# Patient Record
Sex: Female | Born: 1985 | Hispanic: No | Marital: Single | Smoking: Never smoker
Health system: Southern US, Community
[De-identification: ages and names within clinical notes are randomized; demographics above are authoritative.]

## PROBLEM LIST (undated history)

## (undated) DIAGNOSIS — I10 Essential (primary) hypertension: Secondary | ICD-10-CM

## (undated) HISTORY — PX: APPENDECTOMY: SHX54

---

## 2017-10-09 ENCOUNTER — Ambulatory Visit (HOSPITAL_COMMUNITY)
Admission: EM | Admit: 2017-10-09 | Discharge: 2017-10-09 | Disposition: A | Discharge status: Home / Self Care | Payer: Self-pay

## 2017-10-09 NOTE — ED Notes (Signed)
Per pt access, pt left 

## 2021-07-02 ENCOUNTER — Encounter (HOSPITAL_BASED_OUTPATIENT_CLINIC_OR_DEPARTMENT_OTHER): Payer: Self-pay | Admitting: Orthopedic Surgery

## 2021-07-02 ENCOUNTER — Other Ambulatory Visit: Payer: Self-pay

## 2021-07-02 ENCOUNTER — Other Ambulatory Visit: Payer: Self-pay | Admitting: Orthopedic Surgery

## 2021-07-04 NOTE — H&P (Signed)
Primary Care Provider: Dr. Malen Gauze Referring Provider: Dr. Barb Merino Comp: No Date of Injury or Onset: 06/27/2021  History: CC / Reason for Visit: Right wrist HPI: This patient is a 36 year old, right-hand-dominant, crew trainer at [employer] who indicates that she slipped and fell onto an outstretched hand.  She saw Dr. Cleophas Dunker in [place] yesterday who x-rayed her and found her to have a scaphoid fracture.  She was placed into a thumb spica splint and referred here for further evaluation and treatment.  Past medical history, past surgical history, family history, social history, medications, allergies and review of systems are thoroughly reviewed by me, signed and scanned into SRS today.  The patient is otherwise healthy.  Exam:  Vitals: Refer to EMR. Constitutional:  WD, WN, NAD HEENT:  NCAT, EOMI Neuro/Psych:  Alert & oriented to person, place, and time; appropriate mood & affect Lymphatic: No generalized UE edema or lymphadenopathy Extremities / MSK:  Both UE are normal with respect to appearance, ranges of motion, joint stability, muscle strength/tone, sensation, & perfusion except as otherwise noted:  The right wrist is mildly swollen over the radial aspect.  She has full digital motion of index through long finger.  There is tenderness with wrist range of motion and thumb range of motion.  There is also tenderness directly in the anatomical snuffbox.  Watson's not tested.  NVI.  Labs / Xrays:  No radiographic studies obtained today.  4 views of the right wrist were ordered and obtained yesterday and viewed on spectra and demonstrated a closed, waist of the scaphoid fracture with mild displacement.  Assessment: Right scaphoid fracture  Plan:  We discussed these findings including a closed conservative treatment in a thumb spica cast versus open treatment with internal fixation utilizing a screw.  We discussed the pros and cons of both of these procedures.  The patient wishes to  move forward with ORIF of right scaphoid fracture.  The details of the operative procedure were discussed with the patient.  Questions were invited and answered.  In addition to the goal of the procedure, the risks of the procedure to include but not limited to bleeding; infection; damage to the nerves or blood vessels that could result in bleeding, numbness, weakness, chronic pain, and the need for additional procedures; stiffness; the need for revision surgery; and anesthetic risks were reviewed.  No specific outcome was guaranteed or implied.    Work status: All interested parties should please consider this patient to be out of work entirely if no work is available that complies with the restrictions detailed below.  If these restrictions result in the patient being out of work entirely, the employer is expected to provide documentation of such to all interested third parties such as disability insurance companies: With the right upper extremity, must wear splint.  No lifting gripping or grasping greater than pencil and paper tasks.  Electronically verified by:  Cliffton Asters Janee Morn, MD

## 2021-07-05 ENCOUNTER — Ambulatory Visit (HOSPITAL_BASED_OUTPATIENT_CLINIC_OR_DEPARTMENT_OTHER): Payer: Medicaid Other | Admitting: Certified Registered"

## 2021-07-05 ENCOUNTER — Ambulatory Visit (HOSPITAL_BASED_OUTPATIENT_CLINIC_OR_DEPARTMENT_OTHER)
Admission: RE | Admit: 2021-07-05 | Discharge: 2021-07-05 | Disposition: A | Discharge status: Home / Self Care | Payer: Medicaid Other | Source: Ambulatory Visit | Attending: Orthopedic Surgery | Admitting: Orthopedic Surgery

## 2021-07-05 ENCOUNTER — Ambulatory Visit (HOSPITAL_COMMUNITY): Payer: Medicaid Other

## 2021-07-05 ENCOUNTER — Encounter (HOSPITAL_BASED_OUTPATIENT_CLINIC_OR_DEPARTMENT_OTHER)
Admission: RE | Disposition: A | Discharge status: Home / Self Care | Payer: Self-pay | Source: Ambulatory Visit | Attending: Orthopedic Surgery

## 2021-07-05 ENCOUNTER — Encounter (HOSPITAL_BASED_OUTPATIENT_CLINIC_OR_DEPARTMENT_OTHER): Payer: Self-pay | Admitting: Orthopedic Surgery

## 2021-07-05 ENCOUNTER — Other Ambulatory Visit: Payer: Self-pay

## 2021-07-05 DIAGNOSIS — W010XXA Fall on same level from slipping, tripping and stumbling without subsequent striking against object, initial encounter: Secondary | ICD-10-CM | POA: Insufficient documentation

## 2021-07-05 DIAGNOSIS — S62001A Unspecified fracture of navicular [scaphoid] bone of right wrist, initial encounter for closed fracture: Secondary | ICD-10-CM | POA: Insufficient documentation

## 2021-07-05 DIAGNOSIS — Z6841 Body Mass Index (BMI) 40.0 and over, adult: Secondary | ICD-10-CM | POA: Diagnosis not present

## 2021-07-05 DIAGNOSIS — Z419 Encounter for procedure for purposes other than remedying health state, unspecified: Secondary | ICD-10-CM

## 2021-07-05 HISTORY — PX: ORIF SCAPHOID FRACTURE: SHX2130

## 2021-07-05 HISTORY — DX: Essential (primary) hypertension: I10

## 2021-07-05 LAB — POCT PREGNANCY, URINE: Preg Test, Ur: NEGATIVE

## 2021-07-05 SURGERY — OPEN REDUCTION INTERNAL FIXATION (ORIF) SCAPHOID FRACTURE
Anesthesia: Monitor Anesthesia Care | Laterality: Right

## 2021-07-05 MED ORDER — FENTANYL CITRATE (PF) 100 MCG/2ML IJ SOLN: 25.0000 ug | INTRAMUSCULAR | Status: DC | PRN

## 2021-07-05 MED ORDER — FENTANYL CITRATE (PF) 100 MCG/2ML IJ SOLN
INTRAMUSCULAR | Status: AC
  Filled 2021-07-05: qty 2

## 2021-07-05 MED ORDER — CEFAZOLIN IN SODIUM CHLORIDE 3-0.9 GM/100ML-% IV SOLN
3.0000 g | INTRAVENOUS | Status: AC
  Administered 2021-07-05: 3 g via INTRAVENOUS

## 2021-07-05 MED ORDER — 0.9 % SODIUM CHLORIDE (POUR BTL) OPTIME
TOPICAL | Status: DC | PRN
  Administered 2021-07-05: 120 mL

## 2021-07-05 MED ORDER — ONDANSETRON HCL 4 MG/2ML IJ SOLN
INTRAMUSCULAR | Status: DC | PRN
Start: 2021-07-05 — End: 2021-07-05
  Administered 2021-07-05: 4 mg via INTRAVENOUS

## 2021-07-05 MED ORDER — ACETAMINOPHEN 325 MG PO TABS: 650.0000 mg | ORAL_TABLET | Freq: Four times a day (QID) | ORAL | Status: DC

## 2021-07-05 MED ORDER — CEFAZOLIN IN SODIUM CHLORIDE 3-0.9 GM/100ML-% IV SOLN
INTRAVENOUS | Status: AC
  Filled 2021-07-05: qty 100

## 2021-07-05 MED ORDER — ONDANSETRON HCL 4 MG/2ML IJ SOLN: 4.0000 mg | Freq: Four times a day (QID) | INTRAMUSCULAR | Status: DC | PRN

## 2021-07-05 MED ORDER — LACTATED RINGERS IV SOLN: INTRAVENOUS | Status: DC

## 2021-07-05 MED ORDER — FENTANYL CITRATE (PF) 100 MCG/2ML IJ SOLN
100.0000 ug | Freq: Once | INTRAMUSCULAR | Status: AC
  Administered 2021-07-05: 100 ug via INTRAVENOUS

## 2021-07-05 MED ORDER — MIDAZOLAM HCL 2 MG/2ML IJ SOLN
2.0000 mg | Freq: Once | INTRAMUSCULAR | Status: AC
  Administered 2021-07-05: 2 mg via INTRAVENOUS

## 2021-07-05 MED ORDER — OXYCODONE HCL 5 MG PO TABS
5.0000 mg | ORAL_TABLET | Freq: Four times a day (QID) | ORAL | 0 refills | Status: DC | PRN
Start: 2021-07-05 — End: 2022-08-14

## 2021-07-05 MED ORDER — OXYCODONE HCL 5 MG/5ML PO SOLN: 5.0000 mg | Freq: Once | ORAL | Status: DC | PRN

## 2021-07-05 MED ORDER — BUPIVACAINE-EPINEPHRINE (PF) 0.5% -1:200000 IJ SOLN
INTRAMUSCULAR | Status: DC | PRN
  Administered 2021-07-05: 30 mL via PERINEURAL

## 2021-07-05 MED ORDER — BUPIVACAINE-EPINEPHRINE (PF) 0.5% -1:200000 IJ SOLN
INTRAMUSCULAR | Status: AC
  Filled 2021-07-05: qty 30

## 2021-07-05 MED ORDER — LIDOCAINE HCL (PF) 1 % IJ SOLN
INTRAMUSCULAR | Status: AC
  Filled 2021-07-05: qty 30

## 2021-07-05 MED ORDER — IBUPROFEN 200 MG PO TABS: 600.0000 mg | ORAL_TABLET | Freq: Four times a day (QID) | ORAL | Status: DC

## 2021-07-05 MED ORDER — OXYCODONE HCL 5 MG PO TABS: 5.0000 mg | ORAL_TABLET | Freq: Once | ORAL | Status: DC | PRN

## 2021-07-05 MED ORDER — MIDAZOLAM HCL 2 MG/2ML IJ SOLN
INTRAMUSCULAR | Status: AC
  Filled 2021-07-05: qty 2

## 2021-07-05 MED ORDER — PROPOFOL 500 MG/50ML IV EMUL
INTRAVENOUS | Status: DC | PRN
  Administered 2021-07-05: 75 ug/kg/min via INTRAVENOUS

## 2021-07-05 SURGICAL SUPPLY — 60 items
APL PRP STRL LF DISP 70% ISPRP (MISCELLANEOUS) ×1
BAND INSRT 18 STRL LF DISP RB (MISCELLANEOUS)
BAND RUBBER #18 3X1/16 STRL (MISCELLANEOUS) IMPLANT
BIT DRILL 1.8 CANN MAX VPC (BIT) ×1 IMPLANT
BLADE MINI RND TIP GREEN BEAV (BLADE) IMPLANT
BLADE SURG 15 STRL LF DISP TIS (BLADE) ×1 IMPLANT
BLADE SURG 15 STRL SS (BLADE) ×2
BNDG CMPR 5X2 CHSV 1 LYR STRL (GAUZE/BANDAGES/DRESSINGS)
BNDG CMPR 9X4 STRL LF SNTH (GAUZE/BANDAGES/DRESSINGS) ×1
BNDG COHESIVE 2X5 TAN ST LF (GAUZE/BANDAGES/DRESSINGS) IMPLANT
BNDG COHESIVE 4X5 TAN ST LF (GAUZE/BANDAGES/DRESSINGS) ×2 IMPLANT
BNDG ESMARK 4X9 LF (GAUZE/BANDAGES/DRESSINGS) ×2 IMPLANT
BNDG GAUZE ELAST 4 BULKY (GAUZE/BANDAGES/DRESSINGS) ×2 IMPLANT
BRUSH SCRUB EZ PLAIN DRY (MISCELLANEOUS) ×1 IMPLANT
CANISTER SUCT 1200ML W/VALVE (MISCELLANEOUS) ×2 IMPLANT
CHLORAPREP W/TINT 26 (MISCELLANEOUS) ×2 IMPLANT
CORD BIPOLAR FORCEPS 12FT (ELECTRODE) ×2 IMPLANT
COVER BACK TABLE 60X90IN (DRAPES) ×2 IMPLANT
COVER MAYO STAND STRL (DRAPES) ×2 IMPLANT
CUFF TOURN SGL QUICK 24 (TOURNIQUET CUFF) ×2
CUFF TRNQT CYL 24X4X16.5-23 (TOURNIQUET CUFF) IMPLANT
DRAPE C-ARM 42X72 X-RAY (DRAPES) ×2 IMPLANT
DRAPE EXTREMITY T 121X128X90 (DISPOSABLE) ×2 IMPLANT
DRAPE SURG 17X23 STRL (DRAPES) ×2 IMPLANT
DRSG ADAPTIC 3X8 NADH LF (GAUZE/BANDAGES/DRESSINGS) ×2 IMPLANT
DRSG EMULSION OIL 3X3 NADH (GAUZE/BANDAGES/DRESSINGS) IMPLANT
ELECT REM PT RETURN 9FT ADLT (ELECTROSURGICAL) ×2
ELECTRODE REM PT RTRN 9FT ADLT (ELECTROSURGICAL) ×1 IMPLANT
GAUZE SPONGE 4X4 12PLY STRL LF (GAUZE/BANDAGES/DRESSINGS) ×2 IMPLANT
GLOVE SRG 8 PF TXTR STRL LF DI (GLOVE) ×1 IMPLANT
GLOVE SURG ENC MOIS LTX SZ7.5 (GLOVE) ×3 IMPLANT
GLOVE SURG LTX SZ6.5 (GLOVE) ×2 IMPLANT
GLOVE SURG UNDER POLY LF SZ7 (GLOVE) ×2 IMPLANT
GLOVE SURG UNDER POLY LF SZ8 (GLOVE) ×2
GOWN STRL REUS W/TWL XL LVL3 (GOWN DISPOSABLE) ×2 IMPLANT
K-WIRE .045X4 (WIRE) ×2 IMPLANT
K-WIRE COCR 0.9X95 (WIRE) ×2
KWIRE COCR 0.9X95 (WIRE) IMPLANT
NDL 16GX1 1/2 (NEEDLE) IMPLANT
NDL HYPO 25X1 1.5 SAFETY (NEEDLE) IMPLANT
NEEDLE 16GX1 1/2 (NEEDLE) IMPLANT
NEEDLE HYPO 25X1 1.5 SAFETY (NEEDLE) ×2 IMPLANT
NS IRRIG 1000ML POUR BTL (IV SOLUTION) ×2 IMPLANT
PACK BASIN DAY SURGERY FS (CUSTOM PROCEDURE TRAY) ×2 IMPLANT
PADDING CAST ABS 4INX4YD NS (CAST SUPPLIES)
PADDING CAST ABS COTTON 4X4 ST (CAST SUPPLIES) IMPLANT
PENCIL SMOKE EVACUATOR (MISCELLANEOUS) ×2 IMPLANT
SCREW VPC 2.5X16MM (Screw) ×1 IMPLANT
SLEEVE SCD COMPRESS KNEE MED (STOCKING) ×2 IMPLANT
SLING ARM FOAM STRAP LRG (SOFTGOODS) ×1 IMPLANT
STOCKINETTE 6  STRL (DRAPES) ×1
STOCKINETTE 6 STRL (DRAPES) ×1 IMPLANT
SUT VIC AB 2-0 CT3 27 (SUTURE) ×2 IMPLANT
SUT VICRYL 4-0 PS2 18IN ABS (SUTURE) IMPLANT
SUT VICRYL RAPIDE 4-0 (SUTURE) ×1 IMPLANT
SUT VICRYL RAPIDE 4/0 PS 2 (SUTURE) ×2 IMPLANT
SYR 10ML LL (SYRINGE) IMPLANT
SYR BULB EAR ULCER 3OZ GRN STR (SYRINGE) ×2 IMPLANT
TOWEL GREEN STERILE FF (TOWEL DISPOSABLE) ×2 IMPLANT
UNDERPAD 30X36 HEAVY ABSORB (UNDERPADS AND DIAPERS) ×2 IMPLANT

## 2021-07-05 NOTE — Discharge Instructions (Addendum)
Discharge Instructions   You have a dressing with a plaster splint incorporated in it. Move your fingers as much as possible, making a full fist and fully opening the fist. Elevate your hand to reduce pain & swelling of the digits.  Ice over the operative site may be helpful to reduce pain & swelling.  DO NOT USE HEAT. Pain medicine has been prescribed for you.  Take Tylenol 650 mg and Ibuprofen 600 mg every 6 hours together (over the counter). Take Oxycodone 5 mg additionally for severe post operative pain. Leave the dressing in place until you return to our office.  You may shower, but keep the bandage clean & dry.  You may drive a car when you are off of prescription pain medications and can safely control your vehicle with both hands. Call our office to schedule a post operative appointment in 10-15 days from the date of surgery.   Please call [phone] during normal business hours or [phone] after hours for any problems. Including the following:  - excessive redness of the incisions - drainage for more than 4 days - fever of more than 101.5 F  *Please note that pain medications will not be refilled after hours or on weekends.   Work Status: You may return to work on 07/13/20 with no lifting, gripping or grasping greater than pencil and paper tasks with the right upper extremity.       Post Anesthesia Home Care Instructions  Activity: Get plenty of rest for the remainder of the day. A responsible individual must stay with you for 24 hours following the procedure.  For the next 24 hours, DO NOT: -Drive a car -Advertising copywriter -Drink alcoholic beverages -Take any medication unless instructed by your physician -Make any legal decisions or sign important papers.  Meals: Start with liquid foods such as gelatin or soup. Progress to regular foods as tolerated. Avoid greasy, spicy, heavy foods. If nausea and/or vomiting occur, drink only clear liquids until the nausea  and/or vomiting subsides. Call your physician if vomiting continues.  Special Instructions/Symptoms: Your throat may feel dry or sore from the anesthesia or the breathing tube placed in your throat during surgery. If this causes discomfort, gargle with warm salt water. The discomfort should disappear within 24 hours.  If you had a scopolamine patch placed behind your ear for the management of post- operative nausea and/or vomiting:  1. The medication in the patch is effective for 72 hours, after which it should be removed.  Wrap patch in a tissue and discard in the trash. Wash hands thoroughly with soap and water. 2. You may remove the patch earlier than 72 hours if you experience unpleasant side effects which may include dry mouth, dizziness or visual disturbances. 3. Avoid touching the patch. Wash your hands with soap and water after contact with the patch.        Regional Anesthesia Blocks  1. Numbness or the inability to move the "blocked" extremity may last from 3-48 hours after placement. The length of time depends on the medication injected and your individual response to the medication. If the numbness is not going away after 48 hours, call your surgeon.  2. The extremity that is blocked will need to be protected until the numbness is gone and the  Strength has returned. Because you cannot feel it, you will need to take extra care to avoid injury. Because it may be weak, you may have difficulty moving it or using it. You may not  know what position it is in without looking at it while the block is in effect.  3. For blocks in the legs and feet, returning to weight bearing and walking needs to be done carefully. You will need to wait until the numbness is entirely gone and the strength has returned. You should be able to move your leg and foot normally before you try and bear weight or walk. You will need someone to be with you when you first try to ensure you do not fall and possibly risk  injury.  4. Bruising and tenderness at the needle site are common side effects and will resolve in a few days.  5. Persistent numbness or new problems with movement should be communicated to the surgeon or the [facility] [phone] [address] [facility] [phone]).

## 2021-07-05 NOTE — Progress Notes (Signed)
Assisted Dr. Hodierne with right, ultrasound guided, supraclavicular block. Side rails up, monitors on throughout procedure. See vital signs in flow sheet. Tolerated Procedure well.  

## 2021-07-05 NOTE — Transfer of Care (Signed)
Immediate Anesthesia Transfer of Care Note  Patient: Robyn Hanna  Procedure(s) Performed: OPEN REDUCTION INTERNAL FIXATION OF RIGHT SCAPHOID (Right)  Patient Location: PACU  Anesthesia Type:MAC combined with regional for post-op pain  Level of Consciousness: awake, alert , oriented and patient cooperative  Airway & Oxygen Therapy: Patient Spontanous Breathing and Patient connected to face mask oxygen  Post-op Assessment: Report given to RN and Post -op Vital signs reviewed and stable  Post vital signs: Reviewed and stable  Last Vitals:  Vitals Value Taken Time  BP    Temp    Pulse 95 07/05/21 0831  Resp    SpO2 86 % 07/05/21 0831  Vitals shown include unvalidated device data.  Last Pain:  Vitals:   07/05/21 0635  TempSrc: Oral  PainSc: 7       Patients Stated Pain Goal: 3 ([id number])  Complications: No notable events documented.

## 2021-07-05 NOTE — Anesthesia Preprocedure Evaluation (Signed)
Anesthesia Evaluation  Patient identified by MRN, date of birth, ID band Patient awake    Reviewed: Allergy & Precautions, H&P , NPO status , Patient's Chart, lab work & pertinent test results  Airway Mallampati: II   Neck ROM: full    Dental   Pulmonary neg pulmonary ROS,    breath sounds clear to auscultation       Cardiovascular hypertension,  Rhythm:regular Rate:Normal     Neuro/Psych    GI/Hepatic   Endo/Other  Morbid obesity  Renal/GU      Musculoskeletal   Abdominal   Peds  Hematology   Anesthesia Other Findings   Reproductive/Obstetrics                             Anesthesia Physical Anesthesia Plan  ASA: 2  Anesthesia Plan: MAC and Regional   Post-op Pain Management:    Induction: Intravenous  PONV Risk Score and Plan: 2 and Ondansetron, Propofol infusion, Midazolam and Treatment may vary due to age or medical condition  Airway Management Planned: Simple Face Mask  Additional Equipment:   Intra-op Plan:   Post-operative Plan:   Informed Consent: I have reviewed the patients History and Physical, chart, labs and discussed the procedure including the risks, benefits and alternatives for the proposed anesthesia with the patient or authorized representative who has indicated his/her understanding and acceptance.     Dental advisory given  Plan Discussed with: CRNA, Anesthesiologist and Surgeon  Anesthesia Plan Comments:         Anesthesia Quick Evaluation

## 2021-07-05 NOTE — Op Note (Signed)
07/05/2021  7:30 AM  PATIENT:  Robyn Hanna  36 y.o. female  PRE-OPERATIVE DIAGNOSIS: Right scaphoid fracture  POST-OPERATIVE DIAGNOSIS:  Same  PROCEDURE: ORIF right scaphoid fracture  SURGEON: Rayvon Char. Grandville Silos, MD  PHYSICIAN ASSISTANT: Morley Kos, OPA-C  ANESTHESIA: Regional/MAC  SPECIMENS:  None  DRAINS:   None  EBL: Less than 10 mL  PREOPERATIVE INDICATIONS:  [name] is a  36 y.o. female with a slightly displaced right scaphoid waist fracture.  The risks benefits and alternatives were discussed with the patient preoperatively including but not limited to the risks of infection, bleeding, nerve injury, cardiopulmonary complications, the need for revision surgery, among others, and the patient verbalized understanding and consented to proceed.  OPERATIVE IMPLANTS: Biomet 2.5 mm VPC screw, length 16  OPERATIVE PROCEDURE:  After receiving prophylactic antibiotics and a regional block, the patient was escorted to the operative theatre and placed in a supine position.  A surgical time-out was performed during which the planned procedure, proposed operative site, and the correct patient identity were compared to the operative consent and agreement confirmed by the circulating nurse according to current facility policy.  Following application of a tourniquet to the operative extremity, the exposed skin was prescribed with a Hibiclens brush before being formally prepped with Chloraprep and draped in the usual sterile fashion.  The limb was exsanguinated with an Esmarch bandage and the tourniquet inflated to approximately 123mHg higher than systolic BP.  Using fluoroscopic guidance, the fracture was reduced through this positioning and secured with a 0.045 inch K wire tentatively.   Incision was made dorsally over the scapholunate interval with spreading dissection down to the capsule.  A 16-gauge needle was then used as a positioning device and soft tissue protector.  It  was placed at the appropriate starting point, and a 0.045 inch K wire driven using fluoroscopic guidance along the path intended for the fixation screw.  Once it was appropriately positioned, it was removed and replaced with the actual guidewire for the screw.  The appropriate length was measured, and the measurement was 20 so a 16 mm screw was selected to help ensure there was no intra-articular metallic prominence.  The screw was then placed with good purchase.  The provisional K wire was removed and final images were obtained.  Wrist range of motion was smooth and fluid, the fracture was well reduced and compressed, and stable.  The screw appeared to be entirely within the confines of the bone when examined fluoroscopically.  The tourniquet was released, hemostasis at the site obtained with direct pressure and the skin was irrigated.  It was closed with 4-0 Vicryl Rapide interrupted suture and a short arm splint dressing was applied.  She was taken to the recovery room in stable condition.  DISPOSITION: She will be discharged home today with typical instructions, returning in 10 to 15 days with new x-rays of the right wrist out of splint to include a scaphoid view and transition to a forearm-based removable splint.

## 2021-07-05 NOTE — Anesthesia Procedure Notes (Signed)
Anesthesia Regional Block: Supraclavicular block   Pre-Anesthetic Checklist: , timeout performed,  Correct Patient, Correct Site, Correct Laterality,  Correct Procedure, Correct Position, site marked,  Risks and benefits discussed,  Surgical consent,  Pre-op evaluation,  At surgeon's request and post-op pain management  Laterality: Right  Prep: chloraprep       Needles:  Injection technique: Single-shot  Needle Type: Echogenic Stimulator Needle     Needle Length: 5cm  Needle Gauge: 22     Additional Needles:   Procedures:, nerve stimulator,,,,,     Nerve Stimulator or Paresthesia:  Response: biceps flexion, 0.45 mA  Additional Responses:   Narrative:  Start time: 07/05/2021 7:10 AM End time: 07/05/2021 7:20 AM Injection made incrementally with aspirations every 5 mL.  Performed by: Personally  Anesthesiologist: Achille Rich, MD  Additional Notes: Functioning IV was confirmed and monitors were applied.  A 68mm 22ga Arrow echogenic stimulator needle was used. Sterile prep and drape,hand hygiene and sterile gloves were used.  Negative aspiration and negative test dose prior to incremental administration of local anesthetic. The patient tolerated the procedure well.  Ultrasound guidance: relevent anatomy identified, needle position confirmed, local anesthetic spread visualized around nerve(s), vascular puncture avoided.  Image printed for medical record.

## 2021-07-05 NOTE — Interval H&P Note (Signed)
History and Physical Interval Note:  07/05/2021 7:30 AM  Robyn Hanna  has presented today for surgery, with the diagnosis of RIGHT SCAPHOID FRACTURE.  The various methods of treatment have been discussed with the patient and family. After consideration of risks, benefits and other options for treatment, the patient has consented to  Procedure(s) with comments: OPEN REDUCTION INTERNAL FIXATION OF RIGHT SCAPHOID (Right) - PRE-OP BLOCK as a surgical intervention.  The patient's history has been reviewed, patient examined, no change in status, stable for surgery.  I have reviewed the patient's chart and labs.  Questions were answered to the patient's satisfaction.     Jodi Marble

## 2021-07-05 NOTE — Anesthesia Postprocedure Evaluation (Signed)
Anesthesia Post Note  Patient: Secretary/administrator  Procedure(s) Performed: OPEN REDUCTION INTERNAL FIXATION OF RIGHT SCAPHOID (Right)     Patient location during evaluation: PACU Anesthesia Type: Regional and MAC Level of consciousness: awake and alert Pain management: pain level controlled Vital Signs Assessment: post-procedure vital signs reviewed and stable Respiratory status: spontaneous breathing, nonlabored ventilation, respiratory function stable and patient connected to nasal cannula oxygen Cardiovascular status: stable and blood pressure returned to baseline Postop Assessment: no apparent nausea or vomiting Anesthetic complications: no   No notable events documented.  Last Vitals:  Vitals:   07/05/21 0900 07/05/21 0915  BP: 117/83 106/77  Pulse: 93 95  Resp: (!) 27 18  Temp:  36.5 C  SpO2: 95% 93%    Last Pain:  Vitals:   07/05/21 0915  TempSrc:   PainSc: 0-No pain                 Josaphine Shimamoto S

## 2021-07-05 NOTE — Anesthesia Procedure Notes (Addendum)
Procedure Name: MAC Date/Time: 07/05/2021 8:01 AM Performed by: Signe Colt, CRNA Pre-anesthesia Checklist: Patient identified, Emergency Drugs available, Suction available, Patient being monitored and Timeout performed Patient Re-evaluated:Patient Re-evaluated prior to induction Oxygen Delivery Method: Simple face mask

## 2021-07-08 ENCOUNTER — Encounter (HOSPITAL_BASED_OUTPATIENT_CLINIC_OR_DEPARTMENT_OTHER): Payer: Self-pay | Admitting: Orthopedic Surgery

## 2021-09-15 NOTE — Progress Notes (Signed)
?Cardiology Office Note:   ? ?Date:  09/16/2021  ? ?IDCorlene Hanna, DOB [birth year], MRN [id number] ? ?PCP:  Darryll Capers, PA-C ?  ?[facility] Providers ?Cardiologist:  None    ? ?Referring MD: Geraldine Contras, MD  ? ?CC: HF ?Consulted for the evaluation of new HF at the behest of [place], [place], [place] ? ? ?History of Present Illness:   ? ?Robyn Hanna is a 36 y.o. female with a hx of HTN, new HFrEF (35%) from [facility] seen to establish care. ? ?Patient notes that she is feeling better. ?She recently was seen by [facility]: had new SP found to have LVEF 35%.  She lives in [place].  She has [place] [employer].  She was not able to be covered by her [facility] cardiologist.  She did not end up getting her NM Stress test.  She is out of Coreg.  She need financial support for her ARNI. ? ?Since last visit she has had new chest pain- chest tightness with mild exertion.  No history or early CAD with family.  No family history of HF.  She ran out of her Coreg and has had racing heart since.  She has been going to HF classes and has made dietary changes.  She has had no syncope.  She had a mechanical flat getting out of her truck and broke her R wrist.  She is pending sleep study. ? ?Patient reports prior cardiac testing including Echo showing moderate MR and EF 35% (4/22 [facility]). Started On Coreg 6.25 mg PO BID and Entresto 24/26 mg. ? ?No history of pre-eclampsia, gestation HTN or gestational DM.  No Fen-Phen or drug use. ? ? ?Past Medical History:  ?Diagnosis Date  ? Hypertension   ? ? ?Past Surgical History:  ?Procedure Laterality Date  ? APPENDECTOMY    ? ORIF SCAPHOID FRACTURE Right 07/05/2021  ? Procedure: OPEN REDUCTION INTERNAL FIXATION OF RIGHT SCAPHOID;  Surgeon: Mack Hook, MD;  Location: [facility];  Service: Orthopedics;  Laterality: Right;  PRE-OP BLOCK  ? ? ?Current Medications: ?Current Meds  ?Medication Sig  ? acetaminophen (TYLENOL) 325 MG tablet Take 2 tablets (650 mg  total) by mouth every 6 (six) hours.  ? carvedilol (COREG) 6.25 MG tablet Take 1 tablet (6.25 mg total) by mouth 2 (two) times daily.  ? diazepam (VALIUM) 10 MG tablet Take 10 mg by mouth every 6 (six) hours as needed for anxiety.  ? ibuprofen (ADVIL) 200 MG tablet Take 3 tablets (600 mg total) by mouth every 6 (six) hours.  ? ivabradine (CORLANOR) 5 MG TABS tablet Take 3 tablets (15 mg total) by mouth once for 1 dose. Take 2 hours prior to CT Scan  ? metoprolol tartrate (LOPRESSOR) 100 MG tablet Take 1 tablet (100 mg total) by mouth once for 1 dose. Take 2 hours prior to CT scan  ? montelukast (SINGULAIR) 10 MG tablet Take 10 mg by mouth at bedtime.  ? norethindrone-ethinyl estradiol-iron (ESTROSTEP FE) 1-20/1-30/1-35 MG-MCG tablet Take 1 tablet by mouth daily.  ? sacubitril-valsartan (ENTRESTO) 49-51 MG Take 1 tablet by mouth 2 (two) times daily.  ? [DISCONTINUED] carvedilol (COREG) 3.125 MG tablet Take 3.125 mg by mouth 2 (two) times daily.  ? [DISCONTINUED] ENTRESTO 24-26 MG Take 1 tablet by mouth 2 (two) times daily.  ?  ? ?Allergies:   Patient has no known allergies.  ? ?Social History  ? ?Socioeconomic History  ? Marital status: Single  ?  Spouse  name: Not on file  ? Number of children: Not on file  ? Years of education: Not on file  ? Highest education level: Not on file  ?Occupational History  ? Not on file  ?Tobacco Use  ? Smoking status: Never  ? Smokeless tobacco: Never  ?Vaping Use  ? Vaping Use: Never used  ?Substance and Sexual Activity  ? Alcohol use: Never  ? Drug use: Never  ? Sexual activity: Not on file  ?Other Topics Concern  ? Not on file  ?Social History Narrative  ? Not on file  ? ?Social Determinants of Health  ? ?Financial Resource Strain: Not on file  ?Food Insecurity: Not on file  ?Transportation Needs: Not on file  ?Physical Activity: Not on file  ?Stress: Not on file  ?Social Connections: Not on file  ?  ? ?Family History: ?The patient's family history includes Cancer in her mother;  Diabetes in her father. ? ?ROS:   ?Please see the history of present illness.    ? All other systems reviewed and are negative. ? ?EKGs/Labs/Other Studies Reviewed:   ? ?Recent Labs: ?No results found for requested labs within last 8760 hours.  ?Recent Lipid Panel ?No results found for: CHOL, TRIG, HDL, CHOLHDL, VLDL, LDLCALC, LDLDIRECT ? ?    ? ?Physical Exam:   ? ?VS:  BP 132/80   Pulse (!) 115   Ht 5\' 2"  (1.575 m)   Wt 256 lb 3.2 oz (116.2 kg)   SpO2 99%   BMI 46.86 kg/m?    ? ?Wt Readings from Last 3 Encounters:  ?09/16/21 256 lb 3.2 oz (116.2 kg)  ?07/05/21 268 lb 8.3 oz (121.8 kg)  ?  ? ?GEN:  Well nourished, well developed in no acute distress ?HEENT: Normal ?NECK: No JVD; No carotid bruits ?LYMPHATICS: No lymphadenopathy ?CARDIAC: Regular tachycardia, no murmurs, rubs, gallops ?RESPIRATORY:  Clear to auscultation without rales, wheezing or rhonchi  ?ABDOMEN: Soft, non-tender, non-distended ?MUSCULOSKELETAL:  No edema; No deformity  ?SKIN: Warm and dry ?NEUROLOGIC:  Alert and oriented x 3 ?PSYCHIATRIC:  Normal affect  ? ?ASSESSMENT:   ? ?1. HFrEF (heart failure with reduced ejection fraction) (HCC)   ?2. Essential hypertension   ? ?PLAN:   ? ? Heart Failure Reduced Ejection Fraction  ?HTN ?Sinus tachycardia ?- NYHA class I, Stage B, euvolemic, etiology unclear ?- Diuretic regimen: None needed presently ?- Discussed the importance of fluid restriction of < 2 L, salt restriction, and checking daily weights, she still have one HF class left ?- we had discussed LHC/RHC; after SDM discussion will do Cardiac CT first (metoprolol and ivabradine likely needed) ?- will increase ARNI to 49/51 and will attempt to give coupons or samples ?- will return Coreg 6.25 mg PO BID ?- CBC (For potential CMR), BMP, and pregnancy screen in [place] after new Entresto ?- aldactone likely added after labs ?- SGLT2i at next visit (will need extra SW support) ?- if negative CT will need CMR ? ?Fall f/u me or APP ? ? ?   ? ?Time Spent  Directly with Patient: ?  ?I have spent a total of 60 minutesn with the patient reviewing notes, imaging, EKGs, labs and examining the patient as well as establishing an assessment and plan that was discussed personally with the patient.  > 50% of time was spent in direct patient care. ? ? ? ?Medication Adjustments/Labs and Tests Ordered: ?Current medicines are reviewed at length with the patient today.  Concerns regarding medicines are outlined  above.  ?Orders Placed This Encounter  ?Procedures  ? CT CORONARY MORPH W/CTA COR W/SCORE W/CA W/CM &/OR WO/CM  ? ?Meds ordered this encounter  ?Medications  ? sacubitril-valsartan (ENTRESTO) 49-51 MG  ?  Sig: Take 1 tablet by mouth 2 (two) times daily.  ?  Dispense:  60 tablet  ?  Refill:  6  ? carvedilol (COREG) 6.25 MG tablet  ?  Sig: Take 1 tablet (6.25 mg total) by mouth 2 (two) times daily.  ?  Dispense:  180 tablet  ?  Refill:  3  ? ivabradine (CORLANOR) 5 MG TABS tablet  ?  Sig: Take 3 tablets (15 mg total) by mouth once for 1 dose. Take 2 hours prior to CT Scan  ?  Dispense:  3 tablet  ?  Refill:  0  ? metoprolol tartrate (LOPRESSOR) 100 MG tablet  ?  Sig: Take 1 tablet (100 mg total) by mouth once for 1 dose. Take 2 hours prior to CT scan  ?  Dispense:  1 tablet  ?  Refill:  0  ? ? ?Patient Instructions  ?Medication Instructions:  ?Increase Coreg to 6.25 mg twice daily ?Increase Entresto to 49/51 mg tablets twice daily ? ?Labwork: ?1-2 weeks after starting Entresto: ?CBC ?BMP ?TSH ?Blood PT ? ?Testing/Procedures: ?Cardiac CT ? ?Follow-Up: ?Follow up in the Fall with Dr. Izora Ribashandrasekhar or APP ? ?Any Other Special Instructions Will Be Listed Below (If Applicable). ? ? ? ? ?If you need a refill on your cardiac medications before your next appointment, please call your pharmacy. ? ? ? ? ?Your cardiac CT will be scheduled at one of the below locations:  ? ?[facility] ?[address] ?[place], [place] [address] ?[phone] ? ?OR ? ?[facility] ?[address] ?[address] ?[place], [place] [address] ?[phone] ? ?If scheduled at [facility], please arrive at the [facility] (Entrance C2) of [place]

## 2021-09-16 ENCOUNTER — Ambulatory Visit (INDEPENDENT_AMBULATORY_CARE_PROVIDER_SITE_OTHER): Payer: Medicaid Other | Admitting: Internal Medicine

## 2021-09-16 ENCOUNTER — Encounter: Payer: Self-pay | Admitting: Internal Medicine

## 2021-09-16 ENCOUNTER — Telehealth: Payer: Self-pay | Admitting: Internal Medicine

## 2021-09-16 VITALS — BP 132/80 | HR 115 | Ht 62.0 in | Wt 256.2 lb

## 2021-09-16 DIAGNOSIS — I10 Essential (primary) hypertension: Secondary | ICD-10-CM

## 2021-09-16 DIAGNOSIS — I5020 Unspecified systolic (congestive) heart failure: Secondary | ICD-10-CM | POA: Insufficient documentation

## 2021-09-16 DIAGNOSIS — Z3490 Encounter for supervision of normal pregnancy, unspecified, unspecified trimester: Secondary | ICD-10-CM | POA: Diagnosis not present

## 2021-09-16 MED ORDER — METOPROLOL TARTRATE 100 MG PO TABS: 100.0000 mg | ORAL_TABLET | Freq: Once | ORAL | 0 refills | Status: DC

## 2021-09-16 MED ORDER — IVABRADINE HCL 5 MG PO TABS: 15.0000 mg | ORAL_TABLET | Freq: Once | ORAL | 0 refills | Status: AC

## 2021-09-16 MED ORDER — SACUBITRIL-VALSARTAN 49-51 MG PO TABS: 1.0000 | ORAL_TABLET | Freq: Two times a day (BID) | ORAL | 6 refills | Status: DC

## 2021-09-16 MED ORDER — CARVEDILOL 6.25 MG PO TABS: 6.2500 mg | ORAL_TABLET | Freq: Two times a day (BID) | ORAL | 3 refills | Status: DC

## 2021-09-16 NOTE — Addendum Note (Signed)
Addended by: Marlyn Corporal A on: 09/16/2021 03:39 PM ? ? Modules accepted: Orders ? ?

## 2021-09-16 NOTE — Patient Instructions (Addendum)
Medication Instructions:  ?Increase Coreg to 6.25 mg twice daily ?Increase Entresto to 49/51 mg tablets twice daily ? ?Labwork: ?1-2 weeks after starting Entresto: ?CBC ?BMP ?TSH ?Blood PT ? ?Testing/Procedures: ?Cardiac CT ? ?Follow-Up: ?Follow up in the Fall with Dr. Izora Ribas or APP ? ?Any Other Special Instructions Will Be Listed Below (If Applicable). ? ? ? ? ?If you need a refill on your cardiac medications before your next appointment, please call your pharmacy. ? ? ? ? ?Your cardiac CT will be scheduled at one of the below locations:  ? ?[facility] ?[address] ?[place], [place] [address] ?[phone] ? ?OR ? ?[facility] ?[address] ?[address] ?[place], [place] [address] ?[phone] ? ?If scheduled at [facility], please arrive at the [facility] and Children's Entrance (Entrance C2) of [facility] 30 minutes prior to test start time. ?You can use the FREE valet parking offered at entrance C (encouraged to control the heart rate for the test)  ?Proceed to the [facility] Radiology Department (first floor) to check-in and test prep. ? ?All radiology patients and guests should use entrance C2 at [facility], accessed from [facility], even though the hospital's physical address listed is [address]. ? ? ? ?If scheduled at [facility], please arrive 15 mins early for check-in and test prep. ? ?Please follow these instructions carefully (unless otherwise directed): ? ?On the Night Before the Test: ?Be sure to Drink plenty of water. ?Do not consume any caffeinated/decaffeinated beverages or chocolate 12 hours prior to your test. ?Do not take any antihistamines 12 hours prior to your test. ? ? ?On the Day of the Test: ?Drink plenty of water until 1 hour prior to the test. ?Do not eat any food 4 hours prior to the test. ?You may take your regular medications prior to the test.  ?Take  metoprolol (Lopressor) two hours prior to test. ?HOLD Furosemide/Hydrochlorothiazide morning of the test. ?FEMALES- please wear underwire-free bra if available, avoid dresses & tight clothing\ ?   ?After the Test: ?Drink plenty of water. ?After receiving IV contrast, you may experience a mild flushed feeling. This is normal. ?On occasion, you may experience a mild rash up to 24 hours after the test. This is not dangerous. If this occurs, you can take Benadryl 25 mg and increase your fluid intake. ?If you experience trouble breathing, this can be serious. If it is severe call 911 IMMEDIATELY. If it is mild, please call our office. ?If you take any of these medications: Glipizide/Metformin, Avandament, Glucavance, please do not take 48 hours after completing test unless otherwise instructed. ? ?We will call to schedule your test 2-4 weeks out understanding that some insurance companies will need an authorization prior to the service being performed.  ? ?For non-scheduling related questions, please contact the cardiac imaging nurse navigator should you have any questions/concerns: ?Rockwell Alexandria, Cardiac Imaging Nurse Navigator ?Larey Brick, Cardiac Imaging Nurse Navigator ?[facility] ?Direct Office Dial: [phone]  ? ?For scheduling needs, including cancellations and rescheduling, please call [place], [phone]. ? ? ?

## 2021-09-18 ENCOUNTER — Telehealth: Payer: Self-pay | Admitting: Internal Medicine

## 2021-09-18 NOTE — Telephone Encounter (Signed)
?*  STAT* If patient is at the pharmacy, call can be transferred to refill team. ? ? ?1. Which medications need to be refilled? (please list name of each medication and dose if known)  ? carvedilol (COREG) 6.25 MG tablet  ? ? metoprolol tartrate (LOPRESSOR) 100 MG tablet (Expired)  ? ? sacubitril-valsartan (ENTRESTO) 49-51 MG  ? ?2. Which pharmacy/location (including street and city if local pharmacy) is medication to be sent to?  ?[place], [place] ?3. Do they need a 30 day or 90 day supply? ?90 day except for Entresto and Metoprolol. ? ?Refill was sent to wrong pharmacy the first time. ? ?

## 2021-09-19 NOTE — Telephone Encounter (Signed)
error 

## 2021-09-24 ENCOUNTER — Telehealth: Payer: Self-pay

## 2021-09-24 ENCOUNTER — Telehealth: Payer: Self-pay | Admitting: Internal Medicine

## 2021-09-24 MED ORDER — SACUBITRIL-VALSARTAN 49-51 MG PO TABS: 1.0000 | ORAL_TABLET | Freq: Two times a day (BID) | ORAL | 6 refills | Status: DC

## 2021-09-24 MED ORDER — CARVEDILOL 6.25 MG PO TABS: 6.2500 mg | ORAL_TABLET | Freq: Two times a day (BID) | ORAL | 3 refills | Status: DC

## 2021-09-24 MED ORDER — METOPROLOL TARTRATE 100 MG PO TABS: 100.0000 mg | ORAL_TABLET | Freq: Once | ORAL | 0 refills | Status: DC

## 2021-09-24 NOTE — Telephone Encounter (Signed)
?*  STAT* If patient is at the pharmacy, call can be transferred to refill team. ? ? ?1. Which medications need to be refilled? (please list name of each medication and dose if known)  ? carvedilol (COREG) 6.25 MG tablet  ? ? metoprolol tartrate (LOPRESSOR) 100 MG tablet (Expired  ?sacubitril-valsartan (ENTRESTO) 49-51 MG ? ?2. Which pharmacy/location (including street and city if local pharmacy) is medication to be sent to? [place], [place] ? ?3. Do they need a 30 day or 90 day supply?   ?90 day ?This is pt's second time calling.Pt states the first time medication was sent to the wrong pharmacy. Pt states she is out of medications and would like a call when they have been sent over to pharmacy. Please advise ? ?

## 2021-09-24 NOTE — Telephone Encounter (Signed)
Prior Authorization for Entresto 49-51 mg tablets submitted via Cover My Meds. Waiting on response.  ?

## 2021-09-24 NOTE — Addendum Note (Signed)
Addended by: Roseanne Reno on: 09/24/2021 03:57 PM ? ? Modules accepted: Orders ? ?

## 2021-09-24 NOTE — Telephone Encounter (Signed)
Completed.

## 2021-09-25 NOTE — Telephone Encounter (Signed)
Left a message for pt to call office regarding medication/appeal form ?

## 2021-09-25 NOTE — Telephone Encounter (Signed)
Prior Authorization for Entresto 49-51 mg tablets denied. Will request appeal on behalf of pt. Will have pt come to the office to sign the appeal request form.  ?

## 2021-09-26 ENCOUNTER — Telehealth (HOSPITAL_COMMUNITY): Payer: Self-pay | Admitting: Emergency Medicine

## 2021-09-26 DIAGNOSIS — I10 Essential (primary) hypertension: Secondary | ICD-10-CM

## 2021-09-26 MED ORDER — METOPROLOL TARTRATE 100 MG PO TABS: 100.0000 mg | ORAL_TABLET | Freq: Once | ORAL | 0 refills | Status: DC

## 2021-09-26 MED ORDER — IVABRADINE HCL 5 MG PO TABS: 15.0000 mg | ORAL_TABLET | Freq: Once | ORAL | 0 refills | Status: AC

## 2021-09-26 NOTE — Telephone Encounter (Signed)
Pt states she does not have prescribed medications to take prior to CT and would like to r/s her appt.  ? ?New appt made for 10/03/21 ? ?Rx for metoprolol 100mg  and ivabradine 15mg  sent to [facility]  ? RN Navigator Cardiac Imaging ?[facility] ?[phone] Office  ?[phone] Cell  ? ?

## 2021-09-26 NOTE — Telephone Encounter (Signed)
Pt will come Monday 5/8 to sign appeal request for Entresto 49-51 mg tablets ?

## 2021-09-27 ENCOUNTER — Ambulatory Visit (HOSPITAL_COMMUNITY): Payer: Medicaid Other

## 2021-09-30 NOTE — Telephone Encounter (Signed)
Patient came to office to sign appeal form. Faxed to insurance. Waiting on appeal/denial  ?

## 2021-10-03 ENCOUNTER — Ambulatory Visit (HOSPITAL_COMMUNITY): Admission: RE | Admit: 2021-10-03 | Payer: Medicaid Other | Source: Ambulatory Visit

## 2021-10-08 ENCOUNTER — Telehealth: Payer: Self-pay | Admitting: Internal Medicine

## 2021-10-08 MED ORDER — CARVEDILOL 6.25 MG PO TABS: 6.2500 mg | ORAL_TABLET | Freq: Two times a day (BID) | ORAL | 3 refills | Status: DC

## 2021-10-08 MED ORDER — SACUBITRIL-VALSARTAN 49-51 MG PO TABS: 1.0000 | ORAL_TABLET | Freq: Two times a day (BID) | ORAL | 3 refills | Status: DC

## 2021-10-08 NOTE — Telephone Encounter (Signed)
[place] MEDICAID PREPAID HEALTH PLAN ?

## 2021-10-08 NOTE — Telephone Encounter (Signed)
Coreg and Entresto approved and sent to pharmacy. Atorvastatin not located within pt's chart.  ?

## 2021-10-08 NOTE — Telephone Encounter (Signed)
Prior Authorization for [employer]- Approved  ? ?[id number]. ENTRESTO TAB 49-51MG  is approved through 10/09/2022 ?

## 2021-10-08 NOTE — Telephone Encounter (Signed)
Pts insurance had denied Entresto. Pt would like to know if there is an alternative to this medication. Please advise.  ?

## 2021-10-08 NOTE — Telephone Encounter (Signed)
?*  STAT* If patient is at the pharmacy, call can be transferred to refill team. ? ? ?1. Which medications need to be refilled? (please list name of each medication and dose if known) Carvedilol, Entresto, Atorvastatin and ?Singulair  ? ?2. Which pharmacy/location (including street and city if local pharmacy) is medication to be sent to? [facility] [place] ? ?3. Do they need a 30 day or 90 day supply? 90 days and refills ? ?

## 2021-11-08 ENCOUNTER — Encounter (HOSPITAL_COMMUNITY): Payer: Self-pay

## 2022-01-21 ENCOUNTER — Telehealth (HOSPITAL_COMMUNITY): Payer: Self-pay | Admitting: Emergency Medicine

## 2022-01-21 NOTE — Telephone Encounter (Signed)
Attempted to call patient regarding upcoming cardiac CT appointment. °Left message on voicemail with name and callback number °Sadye Kiernan RN Navigator Cardiac Imaging °[facility] °[phone] Office °[phone] Cell ° °

## 2022-01-22 ENCOUNTER — Ambulatory Visit (HOSPITAL_COMMUNITY): Payer: Medicaid Other

## 2022-02-04 ENCOUNTER — Telehealth (HOSPITAL_COMMUNITY): Payer: Self-pay | Admitting: Emergency Medicine

## 2022-02-04 NOTE — Telephone Encounter (Signed)
Attempted to call patient regarding upcoming cardiac CT appointment. °Left message on voicemail with name and callback number °Nare Gaspari RN Navigator Cardiac Imaging °[facility] °[phone] Office °[phone] Cell ° °

## 2022-02-05 ENCOUNTER — Ambulatory Visit (HOSPITAL_COMMUNITY): Admission: RE | Admit: 2022-02-05 | Payer: Medicaid Other | Source: Ambulatory Visit

## 2022-02-26 NOTE — Progress Notes (Deleted)
Cardiology Clinic Note   Patient Name: Robyn Hanna Date of Encounter: 02/26/2022  Primary Care Provider:  Candida Peeling, PA-C Primary Cardiologist:  None  Patient Profile    36 year old female with a past medical history of hypertension and new onset HFrEF (35%), who is here today to follow-up after having recent episodes of chest pain and chest tightness on mild exertion.  Past Medical History    Past Medical History:  Diagnosis Date   Hypertension    Past Surgical History:  Procedure Laterality Date   APPENDECTOMY     ORIF SCAPHOID FRACTURE Right 07/05/2021   Procedure: OPEN REDUCTION INTERNAL FIXATION OF RIGHT SCAPHOID;  Surgeon: Milly Jakob, MD;  Location: [facility];  Service: Orthopedics;  Laterality: Right;  PRE-OP BLOCK    Allergies  No Known Allergies  History of Present Illness    Robyn Hanna is a 36 year old female with a past medical history of hypertension and new onset of HF R EF (35%) who has previously been evaluated at [facility].  She was recently evaluated at [facility] and found to have an L LVEF 35%.  She lives in [place] but subsequently has [facility].  She was not able to be covered by her [facility] cardiologist.  So subsequently she was unable to get her nuclear medicine stress testing completed.  Since discharge from the hospital she had ran out of her carvedilol and was unable to afford her ARNI.  She was last evaluated in clinic 09/16/2021 where she had complaints of new onset chest pain/chest tightness with mild exertion.  There was no history of early CAD in her family or history of heart failure.  She has been going to heart failure classes and has made several dietary changes.  She did suffer a mechanical fall getting out of her truck and broke her right wrist.  At that time she was also pending a sleep study to rule out sleep apnea.  Previous echocardiogram  completed at outside facility revealed moderate MR and EF of 35%.    Home Medications    Current Outpatient Medications  Medication Sig Dispense Refill   acetaminophen (TYLENOL) 325 MG tablet Take 2 tablets (650 mg total) by mouth every 6 (six) hours.     carvedilol (COREG) 6.25 MG tablet Take 1 tablet (6.25 mg total) by mouth 2 (two) times daily. 180 tablet 3   diazepam (VALIUM) 10 MG tablet Take 10 mg by mouth every 6 (six) hours as needed for anxiety.     ibuprofen (ADVIL) 200 MG tablet Take 3 tablets (600 mg total) by mouth every 6 (six) hours.     metoprolol tartrate (LOPRESSOR) 100 MG tablet Take 1 tablet (100 mg total) by mouth once for 1 dose. Take 2 hours prior to CT scan 1 tablet 0   montelukast (SINGULAIR) 10 MG tablet Take 10 mg by mouth at bedtime.     norethindrone-ethinyl estradiol-iron (ESTROSTEP FE) 1-20/1-30/1-35 MG-MCG tablet Take 1 tablet by mouth daily.     oxyCODONE (ROXICODONE) 5 MG immediate release tablet Take 1 tablet (5 mg total) by mouth every 6 (six) hours as needed for severe pain (severe postop pain). (Patient not taking: Reported on 09/16/2021) 15 tablet 0   sacubitril-valsartan (ENTRESTO) 49-51 MG Take 1 tablet by mouth 2 (two) times daily. 180 tablet 3   No current facility-administered medications for this visit.     Family History    Family History  Problem Relation  Age of Onset   Cancer Mother    Diabetes Father    She indicated that her mother is deceased. She indicated that her father is alive.  Social History    Social History   Socioeconomic History   Marital status: Single    Spouse name: Not on file   Number of children: Not on file   Years of education: Not on file   Highest education level: Not on file  Occupational History   Not on file  Tobacco Use   Smoking status: Never   Smokeless tobacco: Never  Vaping Use   Vaping Use: Never used  Substance and Sexual Activity   Alcohol use: Never   Drug use: Never   Sexual activity:  Not on file  Other Topics Concern   Not on file  Social History Narrative   Not on file   Social Determinants of Health   Financial Resource Strain: Not on file  Food Insecurity: Not on file  Transportation Needs: Not on file  Physical Activity: Not on file  Stress: Not on file  Social Connections: Not on file  Intimate Partner Violence: Not on file     Review of Systems    General:  No chills, fever, night sweats or weight changes.  Cardiovascular:  No chest pain, dyspnea on exertion, edema, orthopnea, palpitations, paroxysmal nocturnal dyspnea. Dermatological: No rash, lesions/masses Respiratory: No cough, dyspnea Urologic: No hematuria, dysuria Abdominal:   No nausea, vomiting, diarrhea, bright red blood per rectum, melena, or hematemesis Neurologic:  No visual changes, wkns, changes in mental status. All other systems reviewed and are otherwise negative except as noted above.     Physical Exam    VS:  There were no vitals taken for this visit. , BMI There is no height or weight on file to calculate BMI.     GEN: Well nourished, well developed, in no acute distress. HEENT: normal. Neck: Supple, no JVD, carotid bruits, or masses. Cardiac: RRR, no murmurs, rubs, or gallops. No clubbing, cyanosis, edema.  Radials/DP/PT 2+ and equal bilaterally.  Respiratory:  Respirations regular and unlabored, clear to auscultation bilaterally. GI: Soft, nontender, nondistended, BS + x 4. MS: no deformity or atrophy. Skin: warm and dry, no rash. Neuro:  Strength and sensation are intact. Psych: Normal affect.  Accessory Clinical Findings    ECG personally reviewed by me today- *** - No acute changes  No results found for: "WBC", "HGB", "HCT", "MCV", "PLT" No results found for: "CREATININE", "BUN", "NA", "K", "CL", "CO2" No results found for: "ALT", "AST", "GGT", "ALKPHOS", "BILITOT" No results found for: "CHOL", "HDL", "LDLCALC", "LDLDIRECT", "TRIG", "CHOLHDL"  No results found  for: "HGBA1C"  Assessment & Plan   1.  ***  Armonte Tortorella, NP 02/26/2022, 6:48 PM

## 2022-02-27 NOTE — Progress Notes (Signed)
Requested PCP notes.  

## 2022-02-28 ENCOUNTER — Ambulatory Visit: Payer: Medicaid Other | Admitting: Cardiology

## 2022-03-20 ENCOUNTER — Ambulatory Visit: Payer: Medicaid Other | Admitting: Cardiology

## 2022-04-08 ENCOUNTER — Ambulatory Visit: Payer: Medicaid Other | Attending: Cardiology | Admitting: Medical

## 2022-04-08 NOTE — Progress Notes (Deleted)
Cardiology Office Note:    Date:  04/08/2022   ID:  Robyn Hanna, DOB 1986/04/21, MRN [id number]  PCP:  Darryll Capers, PA-C  [facility] Cardiologist:  None  [facility] Electrophysiologist:  None   Referring MD: Darryll Capers, PA-C   Chief Complaint: 51-month follow-up  History of Present Illness:    Robyn Hanna is a 36 y.o. female with a hx of hypertension, HFrEF who presents for 76-month follow-up.  Patient was previous seen by [facility].  They found her to have LVEF 35% moderate MR.  Patient lives in [place].  He was unable to be covered by [facility] cardiologist.  Patient ran out of her Coreg.  Patient was seen 09/16/2021 reporting new chest pain/chest tightness with mild exertion.  Left heart cath with discussed, however it was decided to do cardiac CT.  Entresto was increased to 49/51 mg twice daily.  Today,   Past Medical History:  Diagnosis Date   Hypertension     Past Surgical History:  Procedure Laterality Date   APPENDECTOMY     ORIF SCAPHOID FRACTURE Right 07/05/2021   Procedure: OPEN REDUCTION INTERNAL FIXATION OF RIGHT SCAPHOID;  Surgeon: Mack Hook, MD;  Location: [facility];  Service: Orthopedics;  Laterality: Right;  PRE-OP BLOCK    Current Medications: No outpatient medications have been marked as taking for the 04/08/22 encounter (Appointment) with Fransico Michael, Alanzo Lamb H, PA-C.     Allergies:   Patient has no known allergies.   Social History   Socioeconomic History   Marital status: Single    Spouse name: Not on file   Number of children: Not on file   Years of education: Not on file   Highest education level: Not on file  Occupational History   Not on file  Tobacco Use   Smoking status: Never   Smokeless tobacco: Never  Vaping Use   Vaping Use: Never used  Substance and Sexual Activity   Alcohol use: Never   Drug use: Never   Sexual activity: Not on file  Other Topics Concern   Not on file   Social History Narrative   Not on file   Social Determinants of Health   Financial Resource Strain: Not on file  Food Insecurity: Not on file  Transportation Needs: Not on file  Physical Activity: Not on file  Stress: Not on file  Social Connections: Not on file     Family History: The patient's family history includes Cancer in her mother; Diabetes in her father.  ROS:   Please see the history of present illness.     All other systems reviewed and are negative.  EKGs/Labs/Other Studies Reviewed:    The following studies were reviewed today: ***  EKG:  EKG is *** ordered today.  The ekg ordered today demonstrates ***  Recent Labs: No results found for requested labs within last 365 days.  Recent Lipid Panel No results found for: "CHOL", "TRIG", "HDL", "CHOLHDL", "VLDL", "LDLCALC", "LDLDIRECT"   Risk Assessment/Calculations:   {Does this patient have ATRIAL FIBRILLATION?[phone]}   Physical Exam:    VS:  There were no vitals taken for this visit.    Wt Readings from Last 3 Encounters:  09/16/21 256 lb 3.2 oz (116.2 kg)  07/05/21 268 lb 8.3 oz (121.8 kg)     GEN: *** Well nourished, well developed in no acute distress HEENT: Normal NECK: No JVD; No carotid bruits LYMPHATICS: No lymphadenopathy CARDIAC: ***RRR, no murmurs, rubs, gallops RESPIRATORY:  Clear  to auscultation without rales, wheezing or rhonchi  ABDOMEN: Soft, non-tender, non-distended MUSCULOSKELETAL:  No edema; No deformity  SKIN: Warm and dry NEUROLOGIC:  Alert and oriented x 3 PSYCHIATRIC:  Normal affect   ASSESSMENT:    No diagnosis found. PLAN:    In order of problems listed above:  ***  Disposition: Follow up {follow up:15908} with ***   Shared Decision Making/Informed Consent   {Are you ordering a CV Procedure (e.g. stress test, cath, DCCV, TEE, etc)?   Press F2        :[id number]    Signed, Jamare Vanatta Ninfa Meeker, PA-C  04/08/2022 7:50 AM    [place]

## 2022-04-09 ENCOUNTER — Encounter: Payer: Self-pay | Admitting: Medical

## 2022-05-16 ENCOUNTER — Ambulatory Visit: Payer: Medicaid Other | Admitting: Internal Medicine

## 2022-05-22 ENCOUNTER — Encounter: Payer: Self-pay | Admitting: Internal Medicine

## 2022-05-22 ENCOUNTER — Ambulatory Visit: Payer: Medicaid Other | Attending: Internal Medicine | Admitting: Internal Medicine

## 2022-05-22 ENCOUNTER — Ambulatory Visit (HOSPITAL_COMMUNITY)
Admission: RE | Admit: 2022-05-22 | Discharge: 2022-05-22 | Disposition: A | Discharge status: Home / Self Care | Payer: Medicaid Other | Source: Ambulatory Visit | Attending: Internal Medicine | Admitting: Internal Medicine

## 2022-05-22 VITALS — BP 132/87 | HR 117 | Ht 62.0 in | Wt 269.2 lb

## 2022-05-22 DIAGNOSIS — Z9189 Other specified personal risk factors, not elsewhere classified: Secondary | ICD-10-CM

## 2022-05-22 DIAGNOSIS — I509 Heart failure, unspecified: Secondary | ICD-10-CM | POA: Insufficient documentation

## 2022-05-22 DIAGNOSIS — I5023 Acute on chronic systolic (congestive) heart failure: Secondary | ICD-10-CM | POA: Diagnosis not present

## 2022-05-22 DIAGNOSIS — I5020 Unspecified systolic (congestive) heart failure: Secondary | ICD-10-CM | POA: Diagnosis present

## 2022-05-22 DIAGNOSIS — I10 Essential (primary) hypertension: Secondary | ICD-10-CM | POA: Diagnosis not present

## 2022-05-22 MED ORDER — FUROSEMIDE 40 MG PO TABS: 40.0000 mg | ORAL_TABLET | Freq: Two times a day (BID) | ORAL | 3 refills | Status: DC

## 2022-05-22 MED ORDER — METOPROLOL TARTRATE 100 MG PO TABS: ORAL_TABLET | ORAL | 0 refills | Status: DC

## 2022-05-22 MED ORDER — METOPROLOL TARTRATE 100 MG PO TABS: 100.0000 mg | ORAL_TABLET | Freq: Once | ORAL | 0 refills | Status: DC

## 2022-05-22 MED ORDER — POTASSIUM CHLORIDE CRYS ER 20 MEQ PO TBCR: 20.0000 meq | EXTENDED_RELEASE_TABLET | Freq: Two times a day (BID) | ORAL | 3 refills | Status: DC

## 2022-05-22 NOTE — Patient Instructions (Addendum)
Medication Instructions:  Lasix 40 mg twice daily  Potassium Chloride 20 mg twice daily Metoprolol Tartrate 100 mg take 1 tab 2 hours prior to CT scan  *If you need a refill on your cardiac medications before your next appointment, please call your pharmacy*   Lab Work: Your physician recommends that you complete lab work tomorrow at noon and return for lab work in 4 days. CMET (tomorrow & 4 days)  BMET 1 week before CT can  If you have labs (blood work) drawn today and your tests are completely normal, you will receive your results only by: MyChart Message (if you have MyChart) OR A paper copy in the mail If you have any lab test that is abnormal or we need to change your treatment, we will call you to review the results.   Testing/Procedures:   Your cardiac CT will be scheduled at one of the below locations:   [facility] [address] [place], [place] [address] [phone]  OR  [facility] [address] [place], [place] [address] [phone]  OR   [facility] [address] [place], [place] [address] [phone]  If scheduled at [facility], please arrive at the [facility] (Entrance C2) of [facility] 30 minutes prior to test start time. You can use the FREE valet parking offered at entrance C (encouraged to control the heart rate for the test)  Proceed to the [facility] Radiology Department (first floor) to check-in and test prep.  All radiology patients and guests should use entrance C2 at [facility], accessed from [facility], even though the hospital's physical address listed is [address]    If scheduled at [facility] or [facility], please arrive 15 mins early for check-in and test prep.   Please follow these instructions carefully (unless otherwise  directed):  Hold all erectile dysfunction medications at least 3 days (72 hrs) prior to test. (Ie viagra, cialis, sildenafil, tadalafil, etc) We will administer nitroglycerin during this exam.   On the Night Before the Test: Be sure to Drink plenty of water. Do not consume any caffeinated/decaffeinated beverages or chocolate 12 hours prior to your test. Do not take any antihistamines 12 hours prior to your test. If the patient has contrast allergy: Patient will need a prescription for Prednisone and very clear instructions (as follows): Prednisone 50 mg - take 13 hours prior to test Take another Prednisone 50 mg 7 hours prior to test Take another Prednisone 50 mg 1 hour prior to test Take Benadryl 50 mg 1 hour prior to test Patient must complete all four doses of above prophylactic medications. Patient will need a ride after test due to Benadryl.  On the Day of the Test: Drink plenty of water until 1 hour prior to the test. Do not eat any food 1 hour prior to test. You may take your regular medications prior to the test.  Take metoprolol (Lopressor) two hours prior to test. HOLD Furosemide/Hydrochlorothiazide morning of the test. FEMALES- please wear underwire-free bra if available, avoid dresses & tight clothing   *For Clinical Staff only. Please instruct patient the following:* Heart Rate Medication Recommendations for Cardiac CT  Resting HR < 50 bpm  No medication  Resting HR 50-60 bpm and BP >110/50 mmHG   Consider Metoprolol tartrate 25 mg PO 90-120 min prior to scan  Resting HR 60-65  bpm and BP >110/50 mmHG  Metoprolol tartrate 50 mg PO 90-120 minutes prior to scan   Resting HR > 65 bpm and BP >110/50 mmHG  Metoprolol tartrate 100 mg PO 90-120 minutes prior to scan  Consider Ivabradine 10-15 mg PO or a calcium channel blocker for resting HR >60 bpm and contraindication to metoprolol tartrate  Consider Ivabradine 10-15 mg PO in combination with metoprolol tartrate for HR >80  bpm         After the Test: Drink plenty of water. After receiving IV contrast, you may experience a mild flushed feeling. This is normal. On occasion, you may experience a mild rash up to 24 hours after the test. This is not dangerous. If this occurs, you can take Benadryl 25 mg and increase your fluid intake. If you experience trouble breathing, this can be serious. If it is severe call 911 IMMEDIATELY. If it is mild, please call our office. If you take any of these medications: Glipizide/Metformin, Avandament, Glucavance, please do not take 48 hours after completing test unless otherwise instructed.  We will call to schedule your test 2-4 weeks out understanding that some insurance companies will need an authorization prior to the service being performed.   For non-scheduling related questions, please contact the cardiac imaging nurse navigator should you have any questions/concerns: Rockwell Alexandria, Cardiac Imaging Nurse Navigator Larey Brick, Cardiac Imaging Nurse Navigator [facility] Direct Office Dial: [phone]   For scheduling needs, including cancellations and rescheduling, please call [place], [phone].    Follow-Up: At [facility], you and your health needs are our priority.  As part of our continuing mission to provide you with exceptional heart care, we have created designated Provider Care Teams.  These Care Teams include your primary Cardiologist (physician) and Advanced Practice Providers (APPs -  Physician Assistants and Nurse Practitioners) who all work together to provide you with the care you need, when you need it.  We recommend signing up for the patient portal called "MyChart".  Sign up information is provided on this After Visit Summary.  MyChart is used to connect with patients for Virtual Visits (Telemedicine).  Patients are able to view lab/test results, encounter notes, upcoming appointments, etc.  Non-urgent messages  can be sent to your provider as well.   To learn more about what you can do with MyChart, go to [email or web address].    Your next appointment:   3 month(s) Tuesday 05/27/2022 (Any APP) The format for your next appointment:   In Person  Provider:   Luane School, MD    Other Instructions   Important Information About Sugar

## 2022-05-22 NOTE — Progress Notes (Signed)
Cardiology Office Note  Date: 05/22/2022   ID: Lulamae, Skorupski [birth year], MRN [id number]  PCP:  Candida Peeling, PA-C  Cardiologist:  None Electrophysiologist:  None   Reason for Office Visit: Follow-up of cardiomyopathy   History of Present Illness: Shamere Dilworth is a 36 y.o. female known to have cardiomyopathy LVEF 36% diagnosed in 08/2020 presented to cardiology clinic for follow-up visit.  Patient was in her usual state of health until she had a flu shot 7 days prior to presentation. Two days following flu shot, she started to develop chest pain and shortness of breath for which she had ER visit at [facility] on 05/21/2022. EKG remarkable for sinus tachycardia. CTA chest was performed and PE was ruled out. ProBNP was elevated significantly at 1300. She is here for follow-up visit.  She started noticing significant shortness of breath with daily activities after getting flu shot and has been out of breath after walking a few feet (got out of breath after walking from the waiting area to the exam room in the [place] office). Denied any dizziness/lightness, syncope.   Past Medical History:  Diagnosis Date   Hypertension     Past Surgical History:  Procedure Laterality Date   APPENDECTOMY     ORIF SCAPHOID FRACTURE Right 07/05/2021   Procedure: OPEN REDUCTION INTERNAL FIXATION OF RIGHT SCAPHOID;  Surgeon: Milly Jakob, MD;  Location: [facility];  Service: Orthopedics;  Laterality: Right;  PRE-OP BLOCK    Current Outpatient Medications  Medication Sig Dispense Refill   albuterol (VENTOLIN HFA) 108 (90 Base) MCG/ACT inhaler Inhale into the lungs.     carvedilol (COREG) 6.25 MG tablet Take 1 tablet (6.25 mg total) by mouth 2 (two) times daily. 180 tablet 3   diazepam (VALIUM) 10 MG tablet Take 10 mg by mouth every 6 (six) hours as needed for anxiety.     furosemide (LASIX) 40 MG tablet Take 1 tablet (40 mg total) by mouth 2 (two) times daily. 180 tablet 3    metoprolol tartrate (LOPRESSOR) 100 MG tablet Take 1 tab 2 hours prior to CTA 1 tablet 0   montelukast (SINGULAIR) 10 MG tablet Take 10 mg by mouth at bedtime.     norethindrone-ethinyl estradiol-iron (ESTROSTEP FE) 1-20/1-30/1-35 MG-MCG tablet Take 1 tablet by mouth daily.     potassium chloride SA (KLOR-CON M) 20 MEQ tablet Take 1 tablet (20 mEq total) by mouth 2 (two) times daily. 180 tablet 3   sacubitril-valsartan (ENTRESTO) 49-51 MG Take 1 tablet by mouth 2 (two) times daily. 180 tablet 3   acetaminophen (TYLENOL) 325 MG tablet Take 2 tablets (650 mg total) by mouth every 6 (six) hours. (Patient not taking: Reported on 05/22/2022)     ibuprofen (ADVIL) 200 MG tablet Take 3 tablets (600 mg total) by mouth every 6 (six) hours. (Patient not taking: Reported on 05/22/2022)     metoprolol tartrate (LOPRESSOR) 100 MG tablet Take 1 tablet (100 mg total) by mouth once for 1 dose. Take 2 hours prior to CT scan 1 tablet 0   oxyCODONE (ROXICODONE) 5 MG immediate release tablet Take 1 tablet (5 mg total) by mouth every 6 (six) hours as needed for severe pain (severe postop pain). (Patient not taking: Reported on 09/16/2021) 15 tablet 0   No current facility-administered medications for this visit.   Allergies:  Patient has no known allergies.   Social History: The patient  reports that she has never smoked. She has never used smokeless tobacco. She  reports that she does not drink alcohol and does not use drugs.   Family History: The patient's family history includes Cancer in her mother; Diabetes in her father.   ROS:  Please see the history of present illness. Otherwise, complete review of systems is positive for none.  All other systems are reviewed and negative.   Physical Exam: VS:  BP 132/87 (BP Location: Left Arm, Patient Position: Sitting, Cuff Size: Large)   Pulse (!) 117   Ht _0  (1.575 m)   Wt 269 lb 3.2 oz (122.1 kg)   BMI 49.24 kg/m , BMI Body mass index is 49.24 kg/m.  Wt  Readings from Last 3 Encounters:  05/22/22 269 lb 3.2 oz (122.1 kg)  09/16/21 256 lb 3.2 oz (116.2 kg)  07/05/21 268 lb 8.3 oz (121.8 kg)    General: Patient appears comfortable at rest. HEENT: Conjunctiva and lids normal, oropharynx clear with moist mucosa. Neck: Unable to examine JVD due to body habitus Lungs: Clear to auscultation, nonlabored breathing at rest. Cardiac: Regular rate and rhythm, no S3 or significant systolic murmur, no pericardial rub. Abdomen: Soft, nontender, no hepatomegaly, bowel sounds present, no guarding or rebound. Extremities: No pitting edema, distal pulses 2+. Skin: Warm and dry. Musculoskeletal: No kyphosis. Neuropsychiatric: Alert and oriented x3, affect grossly appropriate.  ECG: Sinus tachycardia  Recent Labwork: No results found for requested labs within last 365 days.  No results found for: "CHOL", "TRIG", "HDL", "CHOLHDL", "VLDL", "LDLCALC", "LDLDIRECT"  Other Studies Reviewed Today:   Assessment and Plan: Patient is a 36 year old F known to have cardiomyopathy LVEF 36% diagnosed in 08/2020 presented to cardiology clinic for follow-up visit.  # Acute on chronic systolic heart failure exacerbation, currently decompensated -Patient is grossly volume overloaded and will need IV diuresis. ER visit was recommended but patient refused as she has a 34 year old to take care of at home. Will manage her volume overload on outpatient basis. -Start p.o. Lasix 40 mg twice daily. Start p.o. K CL supplements 20 mEq twice daily.  Obtain c-Met on 05/23/2022 and 05/27/2022. -Continue Coreg 6.25 mg twice daily -Continue Entresto 49-51 mg twice daily -Will need to start [place] or Jardiance after euvolemic (if cost is not prohibitive) -Obtain CTA cardiac 4 weeks from now for ischemia evaluation -Unknown family history of CHF as her mother and grand mother passed away in 07-11-2018 and patient was diagnosed with heart failure reduced ejection fraction in 07/11/2020. Three  generation family history could not be obtained. -Cardiac rehab referral after 6 weeks  I have spent a total of 41 minutes with patient reviewing chart, EKGs, labs and examining patient as well as establishing an assessment and plan that was discussed with the patient.  > 50% of time was spent in direct patient care.     Medication Adjustments/Labs and Tests Ordered: Current medicines are reviewed at length with the patient today.  Concerns regarding medicines are outlined above.   Tests Ordered: Orders Placed This Encounter  Procedures   CT CORONARY FFR DATA PREP & FLUID ANALYSIS   Comprehensive metabolic panel   Comprehensive metabolic panel   Basic metabolic panel   EKG 31-VQMG    Medication Changes: Meds ordered this encounter  Medications   furosemide (LASIX) 40 MG tablet    Sig: Take 1 tablet (40 mg total) by mouth 2 (two) times daily.    Dispense:  180 tablet    Refill:  3   potassium chloride SA (KLOR-CON M) 20 MEQ tablet  Sig: Take 1 tablet (20 mEq total) by mouth 2 (two) times daily.    Dispense:  180 tablet    Refill:  3   metoprolol tartrate (LOPRESSOR) 100 MG tablet    Sig: Take 1 tab 2 hours prior to CTA    Dispense:  1 tablet    Refill:  0   metoprolol tartrate (LOPRESSOR) 100 MG tablet    Sig: Take 1 tablet (100 mg total) by mouth once for 1 dose. Take 2 hours prior to CT scan    Dispense:  1 tablet    Refill:  0    Disposition:  Follow up  4 days with cardiology APP and 3 months with me  Signed, Wetona Viramontes Fidel Levy, MD, 05/22/2022 2:47 PM    [facility] [place]. [address] [place], [place] [address]

## 2022-05-23 ENCOUNTER — Telehealth: Payer: Self-pay | Admitting: Internal Medicine

## 2022-05-23 ENCOUNTER — Other Ambulatory Visit (HOSPITAL_COMMUNITY)
Admission: RE | Admit: 2022-05-23 | Discharge: 2022-05-23 | Disposition: A | Discharge status: Home / Self Care | Payer: Medicaid Other | Source: Ambulatory Visit | Attending: Internal Medicine | Admitting: Internal Medicine

## 2022-05-23 DIAGNOSIS — Z9189 Other specified personal risk factors, not elsewhere classified: Secondary | ICD-10-CM | POA: Insufficient documentation

## 2022-05-23 DIAGNOSIS — I10 Essential (primary) hypertension: Secondary | ICD-10-CM

## 2022-05-23 DIAGNOSIS — I5020 Unspecified systolic (congestive) heart failure: Secondary | ICD-10-CM | POA: Insufficient documentation

## 2022-05-23 LAB — COMPREHENSIVE METABOLIC PANEL
ALT: 27 U/L (ref 0–44)
AST: 16 U/L (ref 15–41)
Albumin: 3.4 g/dL — ABNORMAL LOW (ref 3.5–5.0)
Alkaline Phosphatase: 81 U/L (ref 38–126)
Anion gap: 5 (ref 5–15)
BUN: 10 mg/dL (ref 6–20)
CO2: 23 mmol/L (ref 22–32)
Calcium: 8.3 mg/dL — ABNORMAL LOW (ref 8.9–10.3)
Chloride: 108 mmol/L (ref 98–111)
Creatinine, Ser: 0.63 mg/dL (ref 0.44–1.00)
GFR, Estimated: >60 mL/min (ref >60)
Glucose, Bld: 135 mg/dL — ABNORMAL HIGH (ref 70–99)
Potassium: 3.6 mmol/L (ref 3.5–5.1)
Sodium: 136 mmol/L (ref 135–145)
Total Bilirubin: 0.7 mg/dL (ref 0.3–1.2)
Total Protein: 7 g/dL (ref 6.5–8.1)

## 2022-05-23 MED ORDER — FUROSEMIDE 40 MG PO TABS: 40.0000 mg | ORAL_TABLET | Freq: Two times a day (BID) | ORAL | 3 refills | Status: DC

## 2022-05-23 NOTE — Telephone Encounter (Signed)
Routed to [facility]

## 2022-05-23 NOTE — Telephone Encounter (Signed)
Pt c/o medication issue:  1. Name of Medication:   furosemide (LASIX) 40 MG tablet    2. How are you currently taking this medication (dosage and times per day)?   3. Are you having a reaction (difficulty breathing--STAT)?   4. What is your medication issue? Pt states this medication was sent to the wrong pharmacy and would like for this to be sent to:   [facility] [address], [place] - [address]

## 2022-05-27 ENCOUNTER — Telehealth: Payer: Self-pay

## 2022-05-27 NOTE — Telephone Encounter (Signed)
Patient notified and verbalized understanding. Patient had no questions or concerns at this time. PCP copied 

## 2022-05-27 NOTE — Telephone Encounter (Signed)
-----   Message from Chalmers Guest, MD sent at 05/23/2022  4:36 PM EST ----- Normal potassium and normal serum creatinine. Continue current diuresis with Lasix.

## 2022-05-27 NOTE — Progress Notes (Unsigned)
Cardiology Office Note:    Date:  05/28/2022   ID:  Jessica Checketts, DOB 06/06/1985, MRN [id number]  PCP:  Candida Peeling, [facility] Cardiologist:  Chalmers Guest, MD     Referring MD: Candida Peeling, PA-C   Chief Complaint:  Follow-up     History of Present Illness:   Robyn Hanna is a 37 y.o. female with history of cardiomyopathy LVEF 36% diagnosed in 08/2020.   She saw Dr. Dellia Cloud 05/22/22 with acute on chronic systolic CHF. Lasix 40 mg bid started. Plan for [place] or jardiance if not cost prohibitive. Plan for 4 week CTA for ischemia.  Patient comes in for f/u. Weight is same and short of breath despite taking lasix bid. She initially lost 3 lbs but now gained it back. No edema. Says she's following a low salt diet but had sausage for breakfast. Hasn't had blood work.      Past Medical History:  Diagnosis Date   Hypertension    Current Medications: Current Meds  Medication Sig   albuterol (VENTOLIN HFA) 108 (90 Base) MCG/ACT inhaler Inhale into the lungs.   carvedilol (COREG) 6.25 MG tablet Take 1 tablet (6.25 mg total) by mouth 2 (two) times daily.   diazepam (VALIUM) 10 MG tablet Take 10 mg by mouth every 6 (six) hours as needed for anxiety.   furosemide (LASIX) 40 MG tablet Take 1 tablet (40 mg total) by mouth 2 (two) times daily.   LARIN 24 FE 1-20 MG-MCG(24) tablet Take 1 tablet by mouth daily.   montelukast (SINGULAIR) 10 MG tablet Take 10 mg by mouth at bedtime.   potassium chloride SA (KLOR-CON M) 20 MEQ tablet Take 1 tablet (20 mEq total) by mouth 2 (two) times daily.   sacubitril-valsartan (ENTRESTO) 49-51 MG Take 1 tablet by mouth 2 (two) times daily.    Allergies:   Patient has no known allergies.   Social History   Tobacco Use   Smoking status: Never   Smokeless tobacco: Never  Vaping Use   Vaping Use: Never used  Substance Use Topics   Alcohol use: Never   Drug use: Never    Family Hx: The patient's family history  includes Cancer in her mother; Diabetes in her father.  ROS     Physical Exam:    VS:  BP 102/88   Pulse 94   Ht 5\' 2"  (1.575 m)   Wt 269 lb (122 kg)   SpO2 98%   BMI 49.20 kg/m     Wt Readings from Last 3 Encounters:  05/28/22 269 lb (122 kg)  05/22/22 269 lb 3.2 oz (122.1 kg)  09/16/21 256 lb 3.2 oz (116.2 kg)    Physical Exam  GEN: Obese, in no acute distress  Neck: no JVD, carotid bruits, or masses Cardiac:RRR; no murmurs, rubs, or gallops  Respiratory:  clear to auscultation bilaterally, normal work of breathing GI: soft, nontender, nondistended, + BS Ext: without cyanosis, clubbing, or edema, Good distal pulses bilaterally Neuro:  Alert and Oriented x 3,  Psych: euthymic mood, full affect        EKGs/Labs/Other Test Reviewed:    EKG:  EKG is  not ordered today.    Recent Labs: 05/23/2022: ALT 27; BUN 10; Creatinine, Ser 0.63; Potassium 3.6; Sodium 136   Recent Lipid Panel No results for input(s): "CHOL", "TRIG", "HDL", "VLDL", "LDLCALC", "LDLDIRECT" in the last 8760 hours.   Prior CV Studies: No results found for this or any previous visit  from the past 3650 days.        Risk Assessment/Calculations/Metrics:              ASSESSMENT & PLAN:   No problem-specific Assessment & Plan notes found for this encounter.   Chronic systolic CHF EF 01%. Patient with fluid overload 05/21/22 with BNP>1300. Started on lasix 40 mg bid. Patient initially lost 3 lbs but gained it back. No significant LE edema and lungs clear. Body habitus hard to evaluate fluid. Will check bnp with cmet today. BP too low to add or increase meds.  2 gm sodium diet. CTA ordered for 4 weeks.   HTN but BP quite low today. Await labs.               Dispo:  No follow-ups on file.   Medication Adjustments/Labs and Tests Ordered: Current medicines are reviewed at length with the patient today.  Concerns regarding medicines are outlined above.  Tests Ordered: Orders Placed This  Encounter  Procedures   Comprehensive Metabolic Panel (CMET)   B Nat Peptide   Medication Changes: No orders of the defined types were placed in this encounter.  Signed, Ermalinda Barrios, PA-C  05/28/2022 2:01 PM    [address], [place], [place]  [address] Phone: [phone]; Fax: [phone]

## 2022-05-28 ENCOUNTER — Encounter: Payer: Self-pay | Admitting: Physician Assistant

## 2022-05-28 ENCOUNTER — Other Ambulatory Visit (HOSPITAL_COMMUNITY)
Admission: RE | Admit: 2022-05-28 | Discharge: 2022-05-28 | Disposition: A | Discharge status: Home / Self Care | Payer: Medicaid Other | Source: Ambulatory Visit | Attending: Internal Medicine | Admitting: Internal Medicine

## 2022-05-28 ENCOUNTER — Ambulatory Visit: Payer: Medicaid Other | Attending: Physician Assistant | Admitting: Physician Assistant

## 2022-05-28 VITALS — BP 102/88 | HR 94 | Ht 62.0 in | Wt 269.0 lb

## 2022-05-28 DIAGNOSIS — I5020 Unspecified systolic (congestive) heart failure: Secondary | ICD-10-CM | POA: Diagnosis present

## 2022-05-28 DIAGNOSIS — I10 Essential (primary) hypertension: Secondary | ICD-10-CM

## 2022-05-28 LAB — COMPREHENSIVE METABOLIC PANEL
ALT: 20 U/L (ref 0–44)
AST: 14 U/L — ABNORMAL LOW (ref 15–41)
Albumin: 3.8 g/dL (ref 3.5–5.0)
Alkaline Phosphatase: 82 U/L (ref 38–126)
Anion gap: 7 (ref 5–15)
BUN: 11 mg/dL (ref 6–20)
CO2: 25 mmol/L (ref 22–32)
Calcium: 8.5 mg/dL — ABNORMAL LOW (ref 8.9–10.3)
Chloride: 103 mmol/L (ref 98–111)
Creatinine, Ser: 0.62 mg/dL (ref 0.44–1.00)
GFR, Estimated: >60 mL/min (ref >60)
Glucose, Bld: 90 mg/dL (ref 70–99)
Potassium: 3.9 mmol/L (ref 3.5–5.1)
Sodium: 135 mmol/L (ref 135–145)
Total Bilirubin: 0.4 mg/dL (ref 0.3–1.2)
Total Protein: 7.4 g/dL (ref 6.5–8.1)

## 2022-05-28 LAB — BRAIN NATRIURETIC PEPTIDE: B Natriuretic Peptide: 167 pg/mL — ABNORMAL HIGH (ref 0.0–100.0)

## 2022-05-28 NOTE — Patient Instructions (Addendum)
Medication Instructions:  Your physician recommends that you continue on your current medications as directed. Please refer to the Current Medication list given to you today.   Labwork: BNP CMET  Testing/Procedures: None  Follow-Up: Follow up with Dr. Dellia Cloud in 3-4 weeks.   Any Other Special Instructions Will Be Listed Below (If Applicable).     If you need a refill on your cardiac medications before your next appointment, please call your pharmacy.

## 2022-05-29 ENCOUNTER — Telehealth: Payer: Self-pay | Admitting: Physician Assistant

## 2022-05-29 NOTE — Telephone Encounter (Signed)
Patient called stating she received a call but no message was left.  She thinks it might be for lab results.

## 2022-05-29 NOTE — Telephone Encounter (Signed)
Patient notified and verbalized understanding. 

## 2022-05-29 NOTE — Telephone Encounter (Signed)
Imogene Burn, PA-C 05/29/2022  8:01 AM EST     Heart failure marker way down from 1300 before and only mildly elevated. Continue current treatment and low sodium diet. Keep f/u as scheduled no changes thanks.

## 2022-06-18 ENCOUNTER — Ambulatory Visit: Payer: Medicaid Other | Admitting: Internal Medicine

## 2022-08-14 ENCOUNTER — Encounter: Payer: Self-pay | Admitting: Internal Medicine

## 2022-08-14 ENCOUNTER — Ambulatory Visit: Payer: Medicaid Other | Attending: Internal Medicine | Admitting: Internal Medicine

## 2022-08-14 VITALS — BP 126/76 | HR 88 | Ht 62.0 in | Wt 260.6 lb

## 2022-08-14 DIAGNOSIS — I5020 Unspecified systolic (congestive) heart failure: Secondary | ICD-10-CM

## 2022-08-14 MED ORDER — SACUBITRIL-VALSARTAN 49-51 MG PO TABS: 1.0000 | ORAL_TABLET | Freq: Two times a day (BID) | ORAL | 3 refills | Status: DC

## 2022-08-14 MED ORDER — FUROSEMIDE 40 MG PO TABS: 40.0000 mg | ORAL_TABLET | Freq: Two times a day (BID) | ORAL | 3 refills | Status: DC

## 2022-08-14 MED ORDER — METOPROLOL TARTRATE 100 MG PO TABS: 100.0000 mg | ORAL_TABLET | ORAL | 0 refills | Status: DC

## 2022-08-14 MED ORDER — SPIRONOLACTONE 25 MG PO TABS: 25.0000 mg | ORAL_TABLET | Freq: Every day | ORAL | 3 refills | Status: DC

## 2022-08-14 MED ORDER — CARVEDILOL 6.25 MG PO TABS: 6.2500 mg | ORAL_TABLET | Freq: Two times a day (BID) | ORAL | 3 refills | Status: DC

## 2022-08-14 MED ORDER — DAPAGLIFLOZIN PROPANEDIOL 10 MG PO TABS: 10.0000 mg | ORAL_TABLET | Freq: Every day | ORAL | 11 refills | Status: DC

## 2022-08-14 NOTE — Patient Instructions (Addendum)
Medication Instructions:  Your physician has recommended you make the following change in your medication:   Start Farxiga 10 mg Daily  Start Spironolactone 25 mg Daily   *If you need a refill on your cardiac medications before your next appointment, please call your pharmacy*   Lab Work: Your physician recommends that you return for lab work in: 5 Days   If you have labs (blood work) drawn today and your tests are completely normal, you will receive your results only by: [employer] (if you have MyChart) OR A paper copy in the mail If you have any lab test that is abnormal or we need to change your treatment, we will call you to review the results.   Testing/Procedures: Your physician has requested that you have an echocardiogram. Echocardiography is a painless test that uses sound waves to create images of your heart. It provides your doctor with information about the size and shape of your heart and how well your heart's chambers and valves are working. This procedure takes approximately one hour. There are no restrictions for this procedure. Please do NOT wear cologne, perfume, aftershave, or lotions (deodorant is allowed). Please arrive 15 minutes prior to your appointment time.    Your cardiac CT will be scheduled at one of the below locations:   [facility] [address] [place], [place] [address] (336) [place] [address] [place], [place] [address] [phone]  [facility] [place], [place] [address] [phone]  If scheduled at [facility], please arrive at the [facility] (Entrance C2) of [facility] 30 minutes prior to test start time. You can use the FREE valet parking offered at entrance C (encouraged to control the heart rate for the test)  Proceed to the [facility] Radiology Department (first  floor) to check-in and test prep.  All radiology patients and guests should use entrance C2 at [facility], accessed from [facility], even though the hospital's physical address listed is [address]    If scheduled at [facility] or [facility], please arrive 15 mins early for check-in and test prep.   Please follow these instructions carefully (unless otherwise directed):  Hold all erectile dysfunction medications at least 3 days (72 hrs) prior to test. (Ie viagra, cialis, sildenafil, tadalafil, etc) We will administer nitroglycerin during this exam.   On the Night Before the Test: Be sure to Drink plenty of water. Do not consume any caffeinated/decaffeinated beverages or chocolate 12 hours prior to your test. Do not take any antihistamines 12 hours prior to your test.  On the Day of the Test: Drink plenty of water until 1 hour prior to the test. Do not eat any food 1 hour prior to test. You may take your regular medications prior to the test.  Take metoprolol (Lopressor) two hours prior to test. If you take Furosemide/Hydrochlorothiazide/Spironolactone, please HOLD on the morning of the test. FEMALES- please wear underwire-free bra if available, avoid dresses & tight clothing   *For Clinical Staff only. Please instruct patient the following:* Heart Rate Medication Recommendations for Cardiac CT  Resting HR < 50 bpm  No medication  Resting HR 50-60 bpm and BP >110/50 mmHG   Consider Metoprolol tartrate 25 mg PO 90-120 min prior to scan  Resting HR 60-65 bpm and BP >110/50 mmHG  Metoprolol tartrate 50 mg PO 90-120 minutes prior to scan   Resting HR > 65 bpm and BP >110/50 mmHG  Metoprolol tartrate 100 mg PO 90-120 minutes prior to scan  Consider Ivabradine 10-15 mg PO or a calcium channel blocker for resting HR >60 bpm and contraindication to metoprolol tartrate  Consider Ivabradine 10-15 mg PO in  combination with metoprolol tartrate for HR >80 bpm         After the Test: Drink plenty of water. After receiving IV contrast, you may experience a mild flushed feeling. This is normal. On occasion, you may experience a mild rash up to 24 hours after the test. This is not dangerous. If this occurs, you can take Benadryl 25 mg and increase your fluid intake. If you experience trouble breathing, this can be serious. If it is severe call 911 IMMEDIATELY. If it is mild, please call our office. If you take any of these medications: Glipizide/Metformin, Avandament, Glucavance, please do not take 48 hours after completing test unless otherwise instructed.  We will call to schedule your test 2-4 weeks out understanding that some insurance companies will need an authorization prior to the service being performed.   For non-scheduling related questions, please contact the cardiac imaging nurse navigator should you have any questions/concerns: Marchia Bond, Cardiac Imaging Nurse Navigator Gordy Clement, Cardiac Imaging Nurse Navigator  Heart and Vascular Services Direct Office Dial: [phone]   For scheduling needs, including cancellations and rescheduling, please call [place], [phone].   Follow-Up: At [facility], you and your health needs are our priority.  As part of our continuing mission to provide you with exceptional heart care, we have created designated Provider Care Teams.  These Care Teams include your primary Cardiologist (physician) and Advanced Practice Providers (APPs -  Physician Assistants and Nurse Practitioners) who all work together to provide you with the care you need, when you need it.  We recommend signing up for the patient portal called "MyChart".  Sign up information is provided on this After Visit Summary.  MyChart is used to connect with patients for Virtual Visits (Telemedicine).  Patients are able to view lab/test results, encounter notes,  upcoming appointments, etc.  Non-urgent messages can be sent to your provider as well.   To learn more about what you can do with MyChart, go to [email or web address].    Your next appointment:   4 month(s)  Provider:   Claudina Lick, MD    Other Instructions Thank you for choosing [facility]!

## 2022-08-14 NOTE — Progress Notes (Signed)
Cardiology Office Note  Date: 08/14/2022   ID: Robyn Hanna Dec 12, 1985, MRN [id number]  PCP:  Candida Peeling, PA-C  Cardiologist:  Chalmers Guest, MD Electrophysiologist:  None   Reason for Office Visit: Follow-up of cardiomyopathy   History of Present Illness: Robyn Hanna is a 37 y.o. female known to have cardiomyopathy LVEF 36% diagnosed in 08/2020 presented to cardiology clinic for follow-up visit.  Patient had ADHF in 04/2022 and managed on an outpatient basis with p.o. Lasix 40 mg twice daily with adequate diuresis. She was supposed to get CT cardiac but not performed yet. She is here for follow-up visit. Denies any DOE, orthopnea, PND, LE edema, chest pain, dizziness, syncope, lightheadedness and palpitations.   Past Medical History:  Diagnosis Date   Hypertension     Past Surgical History:  Procedure Laterality Date   APPENDECTOMY     ORIF SCAPHOID FRACTURE Right 07/05/2021   Procedure: OPEN REDUCTION INTERNAL FIXATION OF RIGHT SCAPHOID;  Surgeon: Milly Jakob, MD;  Location: [facility];  Service: Orthopedics;  Laterality: Right;  PRE-OP BLOCK    Current Outpatient Medications  Medication Sig Dispense Refill   albuterol (VENTOLIN HFA) 108 (90 Base) MCG/ACT inhaler Inhale into the lungs.     carvedilol (COREG) 6.25 MG tablet Take 1 tablet (6.25 mg total) by mouth 2 (two) times daily. 180 tablet 3   diazepam (VALIUM) 10 MG tablet Take 10 mg by mouth every 6 (six) hours as needed for anxiety.     furosemide (LASIX) 40 MG tablet Take 1 tablet (40 mg total) by mouth 2 (two) times daily. 180 tablet 3   LARIN 24 FE 1-20 MG-MCG(24) tablet Take 1 tablet by mouth daily.     montelukast (SINGULAIR) 10 MG tablet Take 10 mg by mouth at bedtime.     sacubitril-valsartan (ENTRESTO) 49-51 MG Take 1 tablet by mouth 2 (two) times daily. 180 tablet 3   acetaminophen (TYLENOL) 325 MG tablet Take 2 tablets (650 mg total) by mouth every 6 (six) hours. (Patient  not taking: Reported on 05/28/2022)     ibuprofen (ADVIL) 200 MG tablet Take 3 tablets (600 mg total) by mouth every 6 (six) hours. (Patient not taking: Reported on 05/28/2022)     potassium chloride SA (KLOR-CON M) 20 MEQ tablet Take 1 tablet (20 mEq total) by mouth 2 (two) times daily. (Patient not taking: Reported on 08/14/2022) 180 tablet 3   No current facility-administered medications for this visit.   Allergies:  Patient has no known allergies.   Social History: The patient  reports that she has never smoked. She has never used smokeless tobacco. She reports that she does not drink alcohol and does not use drugs.   Family History: The patient's family history includes Cancer in her mother; Diabetes in her father.   ROS:  Please see the history of present illness. Otherwise, complete review of systems is positive for none.  All other systems are reviewed and negative.   Physical Exam: VS:  BP 126/76   Pulse 88   Ht 5\' 2"  (1.575 m)   Wt 260 lb 9.6 oz (118.2 kg)   SpO2 99%   BMI 47.66 kg/m , BMI Body mass index is 47.66 kg/m.  Wt Readings from Last 3 Encounters:  08/14/22 260 lb 9.6 oz (118.2 kg)  05/28/22 269 lb (122 kg)  05/22/22 269 lb 3.2 oz (122.1 kg)    General: Patient appears comfortable at rest. HEENT: Conjunctiva and lids normal, oropharynx  clear with moist mucosa. Neck: Unable to examine JVD due to body habitus Lungs: Clear to auscultation, nonlabored breathing at rest. Cardiac: Regular rate and rhythm, no S3 or significant systolic murmur, no pericardial rub. Abdomen: Soft, nontender, no hepatomegaly, bowel sounds present, no guarding or rebound. Extremities: No pitting edema, distal pulses 2+. Skin: Warm and dry. Musculoskeletal: No kyphosis. Neuropsychiatric: Alert and oriented x3, affect grossly appropriate.  ECG: Sinus tachycardia  Recent Labwork: 05/28/2022: ALT 20; AST 14; B Natriuretic Peptide 167.0; BUN 11; Creatinine, Ser 0.62; Potassium 3.9; Sodium 135   No results found for: "CHOL", "TRIG", "HDL", "CHOLHDL", "VLDL", "LDLCALC", "LDLDIRECT"  Other Studies Reviewed Today:   Assessment and Plan: Patient is a 37 year old F known to have cardiomyopathy LVEF 36% diagnosed in Sep 10, 2020 presented to cardiology clinic for follow-up visit.  # New onset cardiomyopathy diagnosed in 09-10-2020 -Continue Lasix 40 mg twice daily -Continue carvedilol 6.25 mg twice daily -Continue Entresto 49-51 mg twice daily -Start spironolactone 25 mg once daily -Start Farxiga 10 mg once daily -Obtain BMP in 5 days -Obtain CTA cardiac -Repeat echocardiogram in 4 months -Unknown family history of CHF as her mother and grand mother passed away in 09-11-2018 and patient was diagnosed with heart failure reduced ejection fraction in 10-Sep-2020. Three generation family history could not be obtained.   I have spent a total of 20 minutes with patient reviewing chart, EKGs, labs and examining patient as well as establishing an assessment and plan that was discussed with the patient.  > 50% of time was spent in direct patient care.     Medication Adjustments/Labs and Tests Ordered: Current medicines are reviewed at length with the patient today.  Concerns regarding medicines are outlined above.   Tests Ordered: Orders Placed This Encounter  Procedures   Basic Metabolic Panel (BMET)     Medication Changes: Meds ordered this encounter  Medications   dapagliflozin propanediol (FARXIGA) 10 MG TABS tablet    Sig: Take 1 tablet (10 mg total) by mouth daily before breakfast.    Dispense:  30 tablet    Refill:  11   spironolactone (ALDACTONE) 25 MG tablet    Sig: Take 1 tablet (25 mg total) by mouth daily.    Dispense:  90 tablet    Refill:  3   sacubitril-valsartan (ENTRESTO) 49-51 MG    Sig: Take 1 tablet by mouth 2 (two) times daily.    Dispense:  180 tablet    Refill:  3   furosemide (LASIX) 40 MG tablet    Sig: Take 1 tablet (40 mg total) by mouth 2 (two) times daily.     Dispense:  180 tablet    Refill:  3   carvedilol (COREG) 6.25 MG tablet    Sig: Take 1 tablet (6.25 mg total) by mouth 2 (two) times daily.    Dispense:  180 tablet    Refill:  3      Disposition:  Follow up 4 months  Signed, Katriel Cutsforth Fidel Levy, MD, 08/14/2022 2:51 PM    [facility] at [facility] [address] [place], [place] [address]

## 2022-08-14 NOTE — Addendum Note (Signed)
Addended by: Levonne Hubert on: 08/14/2022 03:36 PM   Modules accepted: Orders

## 2022-12-24 ENCOUNTER — Encounter: Payer: Self-pay | Admitting: Internal Medicine

## 2022-12-24 ENCOUNTER — Ambulatory Visit: Payer: Medicaid Other | Attending: Internal Medicine | Admitting: Internal Medicine

## 2022-12-24 ENCOUNTER — Ambulatory Visit (INDEPENDENT_AMBULATORY_CARE_PROVIDER_SITE_OTHER): Payer: Medicaid Other

## 2022-12-24 VITALS — BP 120/80 | HR 65 | Ht 62.0 in | Wt 244.0 lb

## 2022-12-24 DIAGNOSIS — I5020 Unspecified systolic (congestive) heart failure: Secondary | ICD-10-CM | POA: Diagnosis not present

## 2022-12-24 DIAGNOSIS — I429 Cardiomyopathy, unspecified: Secondary | ICD-10-CM

## 2022-12-24 DIAGNOSIS — I10 Essential (primary) hypertension: Secondary | ICD-10-CM

## 2022-12-24 MED ORDER — SACUBITRIL-VALSARTAN 97-103 MG PO TABS: 1.0000 | ORAL_TABLET | Freq: Two times a day (BID) | ORAL | 1 refills | Status: DC

## 2022-12-24 MED ORDER — PERFLUTREN LIPID MICROSPHERE
1.0000 mL | INTRAVENOUS | Status: AC | PRN
  Administered 2022-12-24: 2 mL via INTRAVENOUS

## 2022-12-24 NOTE — Progress Notes (Unsigned)
Cardiology Office Note  Date: 12/24/2022   ID: Robyn, Hanna [birth year], MRN [id number]  PCP:  Darryll Capers, PA-C  Cardiologist:  Marjo Bicker, MD Electrophysiologist:  None   Reason for Office Visit: Follow-up of cardiomyopathy   History of Present Illness: Robyn Hanna is a 37 y.o. female known to have cardiomyopathy LVEF 36% diagnosed in 08/2020 presented to cardiology clinic for follow-up visit.  Patient had ADHF in 04/2022 and managed on an outpatient basis with p.o. Lasix 40 mg twice daily with adequate diuresis. She was supposed to get CT cardiac but not performed yet.  She is on GDMT and repeat echocardiogram performed today showed LVEF 30% and no evidence of LV thrombus.  She is here for follow-up visit, no symptoms of DOE, orthopnea, PND or leg swelling.  No angina.  No dizziness, syncope.  Overall doing great.   Past Medical History:  Diagnosis Date   Hypertension     Past Surgical History:  Procedure Laterality Date   APPENDECTOMY     ORIF SCAPHOID FRACTURE Right 07/05/2021   Procedure: OPEN REDUCTION INTERNAL FIXATION OF RIGHT SCAPHOID;  Surgeon: Mack Hook, MD;  Location: [facility];  Service: Orthopedics;  Laterality: Right;  PRE-OP BLOCK    Current Outpatient Medications  Medication Sig Dispense Refill   albuterol (VENTOLIN HFA) 108 (90 Base) MCG/ACT inhaler Inhale into the lungs.     carvedilol (COREG) 6.25 MG tablet Take 1 tablet (6.25 mg total) by mouth 2 (two) times daily. 180 tablet 3   diazepam (VALIUM) 10 MG tablet Take 10 mg by mouth every 6 (six) hours as needed for anxiety.     furosemide (LASIX) 40 MG tablet Take 1 tablet (40 mg total) by mouth 2 (two) times daily. 180 tablet 3   montelukast (SINGULAIR) 10 MG tablet Take 10 mg by mouth at bedtime.     sacubitril-valsartan (ENTRESTO) 97-103 MG Take 1 tablet by mouth 2 (two) times daily. 180 tablet 1   spironolactone (ALDACTONE) 25 MG tablet Take 1 tablet (25 mg total)  by mouth daily. 90 tablet 3   acetaminophen (TYLENOL) 325 MG tablet Take 2 tablets (650 mg total) by mouth every 6 (six) hours. (Patient not taking: Reported on 05/28/2022)     dapagliflozin propanediol (FARXIGA) 10 MG TABS tablet Take 1 tablet (10 mg total) by mouth daily before breakfast. (Patient not taking: Reported on 12/24/2022) 30 tablet 11   ibuprofen (ADVIL) 200 MG tablet Take 3 tablets (600 mg total) by mouth every 6 (six) hours. (Patient not taking: Reported on 05/28/2022)     No current facility-administered medications for this visit.   Allergies:  Patient has no known allergies.   Social History: The patient  reports that she has never smoked. She has never used smokeless tobacco. She reports that she does not drink alcohol and does not use drugs.   Family History: The patient's family history includes Cancer in her mother; Diabetes in her father.   ROS:  Please see the history of present illness. Otherwise, complete review of systems is positive for none.  All other systems are reviewed and negative.   Physical Exam: VS:  BP 120/80   Pulse 65   Ht 5\' 2"  (1.575 m)   Wt 244 lb (110.7 kg)   SpO2 100%   BMI 44.63 kg/m , BMI Body mass index is 44.63 kg/m.  Wt Readings from Last 3 Encounters:  12/24/22 244 lb (110.7 kg)  08/14/22 260 lb 9.6 oz (  118.2 kg)  05/28/22 269 lb (122 kg)    General: Patient appears comfortable at rest. HEENT: Conjunctiva and lids normal, oropharynx clear with moist mucosa. Neck: Unable to examine JVD due to body habitus Lungs: Clear to auscultation, nonlabored breathing at rest. Cardiac: Regular rate and rhythm, no S3 or significant systolic murmur, no pericardial rub. Abdomen: Soft, nontender, no hepatomegaly, bowel sounds present, no guarding or rebound. Extremities: No pitting edema, distal pulses 2+. Skin: Warm and dry. Musculoskeletal: No kyphosis. Neuropsychiatric: Alert and oriented x3, affect grossly appropriate.  ECG: Sinus  tachycardia  Recent Labwork: 05/28/2022: ALT 20; AST 14; B Natriuretic Peptide 167.0; BUN 11; Creatinine, Ser 0.62; Potassium 3.9; Sodium 135  No results found for: "CHOL", "TRIG", "HDL", "CHOLHDL", "VLDL", "LDLCALC", "LDLDIRECT"  Other Studies Reviewed Today:   Assessment and Plan: Patient is a 37 year old F known to have cardiomyopathy LVEF 36% diagnosed in 08/2020 presented to cardiology clinic for follow-up visit.  # New onset cardiomyopathy diagnosed in 08/2020 -Repeat echocardiogram today showed LVEF 30% and no evidence of LV thrombus. Will continue current GDMT except for Entresto dose which will be increased from 49-51 mg to 97-103 mg twice daily, BMP will be repeated in 5 days. 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 Referral to advanced heart failure clinic will also be placed to discuss advanced therapies. -CT cardiac will need to be repeated (ordered in 12/23 but not performed for unclear reasons ). EP referral for ICD candidacy after ischemia evaluation.   I have spent a total of 20 minutes with patient reviewing chart, EKGs, labs and examining patient as well as establishing an assessment and plan that was discussed with the patient.  > 50% of time was spent in direct patient care.     Medication Adjustments/Labs and Tests Ordered: Current medicines are reviewed at length with the patient today.  Concerns regarding medicines are outlined above.   Tests Ordered: Orders Placed This Encounter  Procedures   Basic metabolic panel   EKG 12-Lead     Medication Changes: Meds ordered this encounter  Medications   sacubitril-valsartan (ENTRESTO) 97-103 MG    Sig: Take 1 tablet by mouth 2 (two) times daily.    Dispense:  180 tablet    Refill:  1    12/24/2022-Dose increase      Disposition:  Follow up 4 months  Signed, Navdeep Halt Verne Spurr, MD, 12/24/2022 4:38 PM    [facility] at [facility] [address] [place], [place] [address]

## 2022-12-24 NOTE — Patient Instructions (Addendum)
Medication Instructions:  Your physician has recommended you make the following change in your medication:  Increased Entresto from 49-51 to 97-103 twice a day Continue taking all other medications as prescribed  Labwork: BMET in 5 days at [facility] on 12/31/2022 Cohesion DCM to be done at [facility]  Testing/Procedures: None  Follow-Up: Your physician recommends that you schedule a follow-up appointment in: 4 months  Any Other Special Instructions Will Be Listed Below (If Applicable).  Referred to Heart Failure Clinic Referral to Genetic Testing  If you need a refill on your cardiac medications before your next appointment, please call your pharmacy.

## 2022-12-25 ENCOUNTER — Telehealth: Payer: Self-pay | Admitting: Internal Medicine

## 2022-12-25 NOTE — Telephone Encounter (Signed)
Patient is calling stating she is returning a call she received yesterday. She reports she believes it was in regards to the further testing done with contrast. Please advise.

## 2022-12-26 ENCOUNTER — Other Ambulatory Visit (HOSPITAL_COMMUNITY)
Admission: RE | Admit: 2022-12-26 | Discharge: 2022-12-26 | Disposition: A | Discharge status: Home / Self Care | Payer: Medicaid Other | Source: Ambulatory Visit | Attending: Internal Medicine | Admitting: Internal Medicine

## 2022-12-26 ENCOUNTER — Telehealth: Payer: Self-pay | Admitting: Internal Medicine

## 2022-12-26 DIAGNOSIS — I429 Cardiomyopathy, unspecified: Secondary | ICD-10-CM | POA: Diagnosis present

## 2022-12-26 NOTE — Telephone Encounter (Signed)
Rosalita Chessman states they are unable to draw DCM but [facility] has a similar panel with cohesion + 15 additional markers. She would like to know if she can send the order to [facility]. Please advise.

## 2022-12-26 NOTE — Telephone Encounter (Signed)
Talked to Robyn Hanna and advised that sending panel to [facility] would be acceptable.

## 2023-01-02 ENCOUNTER — Telehealth (HOSPITAL_COMMUNITY): Payer: Self-pay | Admitting: Cardiology

## 2023-01-02 ENCOUNTER — Telehealth: Payer: Self-pay | Admitting: Internal Medicine

## 2023-01-02 NOTE — Telephone Encounter (Signed)
Patient is requesting provider place a referral to [facility] for a 2nd opinion. Pt stated that provider told her that her EF is not improving and she may "need a heart transplant."   Please advise.

## 2023-01-02 NOTE — Telephone Encounter (Signed)
Pt called to f/u w/referral 

## 2023-01-02 NOTE — Telephone Encounter (Signed)
Patient called to request to be referred to [facility] for additional options.

## 2023-01-05 NOTE — Telephone Encounter (Signed)
Patient notified and verbalized understanding. Pt stated she will call [facility] to see about getting scheduled.

## 2023-01-07 NOTE — Telephone Encounter (Signed)
LMOM

## 2023-01-09 NOTE — Telephone Encounter (Signed)
pt aware 

## 2023-01-15 ENCOUNTER — Telehealth (HOSPITAL_COMMUNITY): Payer: Self-pay | Admitting: Cardiology

## 2023-01-22 ENCOUNTER — Other Ambulatory Visit (HOSPITAL_COMMUNITY): Payer: Self-pay

## 2023-01-22 ENCOUNTER — Telehealth (HOSPITAL_COMMUNITY): Payer: Self-pay

## 2023-01-22 ENCOUNTER — Other Ambulatory Visit: Payer: Self-pay

## 2023-01-22 ENCOUNTER — Encounter (HOSPITAL_COMMUNITY): Payer: Self-pay | Admitting: Cardiology

## 2023-01-22 ENCOUNTER — Ambulatory Visit (HOSPITAL_COMMUNITY)
Admission: RE | Admit: 2023-01-22 | Discharge: 2023-01-22 | Disposition: A | Discharge status: Home / Self Care | Payer: Medicaid Other | Source: Ambulatory Visit | Attending: Cardiology | Admitting: Cardiology

## 2023-01-22 VITALS — BP 142/90 | HR 111 | Ht 62.0 in | Wt 237.8 lb

## 2023-01-22 DIAGNOSIS — I5020 Unspecified systolic (congestive) heart failure: Secondary | ICD-10-CM

## 2023-01-22 DIAGNOSIS — R9431 Abnormal electrocardiogram [ECG] [EKG]: Secondary | ICD-10-CM | POA: Diagnosis not present

## 2023-01-22 DIAGNOSIS — I5022 Chronic systolic (congestive) heart failure: Secondary | ICD-10-CM

## 2023-01-22 LAB — CBC
HCT: 39.8 % (ref 36.0–46.0)
Hemoglobin: 13.4 g/dL (ref 12.0–15.0)
MCH: 29.6 pg (ref 26.0–34.0)
MCHC: 33.7 g/dL (ref 30.0–36.0)
MCV: 88.1 fL (ref 80.0–100.0)
Platelets: 313 10*3/uL (ref 150–400)
RBC: 4.52 MIL/uL (ref 3.87–5.11)
RDW: 13.1 % (ref 11.5–15.5)
WBC: 13.8 10*3/uL — ABNORMAL HIGH (ref 4.0–10.5)
nRBC: 0 % (ref 0.0–0.2)

## 2023-01-22 LAB — BASIC METABOLIC PANEL
Anion gap: 12 (ref 5–15)
BUN: 8 mg/dL (ref 6–20)
CO2: 23 mmol/L (ref 22–32)
Calcium: 9.1 mg/dL (ref 8.9–10.3)
Chloride: 101 mmol/L (ref 98–111)
Creatinine, Ser: 0.83 mg/dL (ref 0.44–1.00)
GFR, Estimated: >60 mL/min (ref >60)
Glucose, Bld: 79 mg/dL (ref 70–99)
Potassium: 3.5 mmol/L (ref 3.5–5.1)
Sodium: 136 mmol/L (ref 135–145)

## 2023-01-22 LAB — BRAIN NATRIURETIC PEPTIDE: B Natriuretic Peptide: 61.7 pg/mL (ref 0.0–100.0)

## 2023-01-22 MED ORDER — FUROSEMIDE 40 MG PO TABS: ORAL_TABLET | ORAL | Status: DC

## 2023-01-22 MED ORDER — CARVEDILOL 12.5 MG PO TABS: 12.5000 mg | ORAL_TABLET | Freq: Two times a day (BID) | ORAL | 3 refills | Status: DC

## 2023-01-22 MED ORDER — DAPAGLIFLOZIN PROPANEDIOL 10 MG PO TABS: 10.0000 mg | ORAL_TABLET | Freq: Every day | ORAL | 11 refills | Status: DC

## 2023-01-22 NOTE — Progress Notes (Signed)
Orders placed for right and left heart cath

## 2023-01-22 NOTE — Progress Notes (Signed)
Blood collected for TTR genetic testing per Dr Shirlee Latch.  Order form completed and both shipped by FedEx to [facility].

## 2023-01-22 NOTE — Telephone Encounter (Signed)
Advanced Heart Failure Patient Advocate Encounter  Prior authorization for Marcelline Deist has been submitted and approved. Test billing returns $4 for 90 day supply.  Key: [id number] Effective: 01/22/2023 to 01/22/2024  Burnell Blanks, CPhT Rx Patient Advocate Phone: [phone]

## 2023-01-22 NOTE — Patient Instructions (Addendum)
Medication Changes:  START Farxiga 10 mg Daily  Increase Carvedilol to 12.5 mg Twice daily   Decrease Furosemide to 40 mg (1 tab) in AM and 20 mg (1/2 tab) in PM  Lab Work:  Labs done today, your results will be available in MyChart, we will contact you for abnormal readings.  Your physician recommends that you return for lab work in: 1-2 weeks, we have provided you a prescription to have this done locally  Genetic test has been done, this has to be sent to [place] to be processed and can take 1-2 weeks to get results back.  We will let you know the results.  Testing/Procedures:  Heart Catheterization, see instructions below  Special Instructions // Education:  Do the following things EVERYDAY: Weigh yourself in the morning before breakfast. Write it down and keep it in a log. Take your medicines as prescribed Eat low salt foods--Limit salt (sodium) to 2000 mg per day.  Stay as active as you can everyday Limit all fluids for the day to less than 2 liters   You are scheduled for a Cardiac Catheterization on Friday, September 20 with Dr. Marca Ancona.  1. Please arrive at the [facility] (Main Entrance A) at [facility]: [address] [place], [place] [address] at 8:30 AM (This time is 2 hour(s) before your procedure to ensure your preparation). Free valet parking service is available. You will check in at ADMITTING. The support person will be asked to wait in the waiting room.  It is OK to have someone drop you off and come back when you are ready to be discharged.    Special note: Every effort is made to have your procedure done on time. Please understand that emergencies sometimes delay scheduled procedures.  2. Diet: Do not eat solid foods after midnight.  The patient may have clear liquids until 5am upon the day of the procedure.  3. Labs: Done today  4. Medication instructions in preparation for your procedure:   Friday 9/20 AM DO NOT TAKE: Furosemide,  Farxiga, or Spironolactone   On the morning of your procedure, take any morning medicines NOT listed above.  You may use sips of water.  5. Plan to go home the same day, you will only stay overnight if medically necessary. 6. Bring a current list of your medications and current insurance cards. 7. You MUST have a responsible person to drive you home. 8. Someone MUST be with you the first 24 hours after you arrive home or your discharge will be delayed. 9. Please wear clothes that are easy to get on and off and wear slip-on shoes.  Thank you for allowing [place] to care for you!   -- [facility]   Follow-Up in: 6-8 weeks   At the [facility], you and your health needs are our priority. We have a designated team specialized in the treatment of Heart Failure. This Care Team includes your primary Heart Failure Specialized Cardiologist (physician), Advanced Practice Providers (APPs- Physician Assistants and Nurse Practitioners), and Pharmacist who all work together to provide you with the care you need, when you need it.   You may see any of the following providers on your designated Care Team at your next follow up:  Dr. Arvilla Meres Dr. Marca Ancona Dr. Marcos Eke, NP Robbie Lis, [place] [facility], [place] Brynda Peon, NP Karle Plumber, PharmD   Please be sure to bring in all  your medications bottles to every appointment.   Need to Contact us:  If you have any questions or concerns before your next appointment please send [place] a message through [place] or call our office at [phone].    TO LEAVE A MESSAGE FOR THE NURSE SELECT OPTION 2, PLEASE LEAVE A MESSAGE INCLUDING: YOUR NAME DATE OF BIRTH CALL BACK NUMBER REASON FOR CALL**this is important as we prioritize the call backs  YOU WILL RECEIVE A CALL BACK THE SAME DAY AS LONG AS YOU CALL BEFORE 4:00 PM

## 2023-01-22 NOTE — Progress Notes (Signed)
Provided patient with Farxiga samples:  LN [id number]; exp 07/23/25; # 14

## 2023-01-26 NOTE — Progress Notes (Signed)
PCP: Darryll Capers, PA-C Cardiology: Dr. Jenene Slicker HF Cardiology: Dr. Shirlee Latch  37 y.o. with history of HTN and chronic systolic CHF was referred to heart failure clinic by Dr. Jenene Slicker for evaluation of cardiomyopathy of uncertain etiology.  She developed dyspnea and was hospitalized in [place] in 4/22.  Echo at that time showed EF of 35%.  She was seen in the ER at [facility] in 12/23 with chest pain, dyspnea, and elevated BNP. CTA chest showed no PE.  She was subsequently referred to Dr. Jenene Slicker in [place]. She was started on Lasix and GDMT. Repeat echo in 7/24 showed EF 25-30%, global hypokinesis, severely dilated LV, mildly decreased RV systolic function, mild MR.   Patient is symptomatically doing well.  She is working 2 jobs.  Dyspnea only with heavy exertion such as walking fast up stairs.  Able to do her work without problems. No orthopnea/PND.  No palpitations.  Rare atypical chest pain.  No lightheadedness.  BP runs mildly high.  Worried because her EF is still low. Patient does not drink or smoke, occasionally uses marijuana but no other drugs.  She does not seem to have a strong family history of CAD or cardiomyopathy.  The diagnosis of CHF does not seem to be related to a pregnancy (has one child).   Labs (8/24): BNP 62, K 3.5, creatinine 0.83, hgb 13.4  ECG (personally reviewed) sinus tachy 101  PMH: 1. HTN 2. Chronic systolic CHF: Echo in [place] in 4/22 with EF 35%.   - Echo (7/24): EF 25-30%, global hypokinesis, severely dilated LV, mildly decreased RV systolic function, mild MR.  SH: Lives in [place], has 1 child.  Works 2 jobs.  No ETOH, uses occasional marijuana but no other drugs.  No tobacco.   FH: No premature CAD, cardiomyopathy, or sudden death that she knows of.   ROS: All systems reviewed and negative except as per HPI.   Current Outpatient Medications  Medication Sig Dispense Refill   albuterol (VENTOLIN HFA) 108 (90 Base) MCG/ACT inhaler  Inhale into the lungs.     diazepam (VALIUM) 10 MG tablet Take 10 mg by mouth every 6 (six) hours as needed for anxiety.     montelukast (SINGULAIR) 10 MG tablet Take 10 mg by mouth at bedtime.     sacubitril-valsartan (ENTRESTO) 97-103 MG Take 1 tablet by mouth 2 (two) times daily. 180 tablet 1   spironolactone (ALDACTONE) 25 MG tablet Take 1 tablet (25 mg total) by mouth daily. 90 tablet 3   carvedilol (COREG) 12.5 MG tablet Take 1 tablet (12.5 mg total) by mouth 2 (two) times daily. 60 tablet 3   dapagliflozin propanediol (FARXIGA) 10 MG TABS tablet Take 1 tablet (10 mg total) by mouth daily before breakfast. 30 tablet 11   furosemide (LASIX) 40 MG tablet Take 1 tablet (40 mg total) by mouth every morning AND 0.5 tablets (20 mg total) every evening.     No current facility-administered medications for this encounter.   BP (!) 142/90   Pulse (!) 111   Ht 5\' 2"  (1.575 m)   Wt 107.9 kg (237 lb 12.8 oz)   SpO2 95%   BMI 43.49 kg/m  General: NAD Neck: No JVD, no thyromegaly or thyroid nodule.  Lungs: Clear to auscultation bilaterally with normal respiratory effort. CV: Nondisplaced PMI.  Heart regular S1/S2, no S3/S4, no murmur.  No peripheral edema.  No carotid bruit.  Normal pedal pulses.  Abdomen: Soft, nontender, no hepatosplenomegaly, no distention.  Skin: Intact  without lesions or rashes.  Neurologic: Alert and oriented x 3.  Psych: Normal affect. Extremities: No clubbing or cyanosis.  HEENT: Normal.   Assessment/Plan: 1. 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  Most recent echo in 7/24 with EF 25-30%, global hypokinesis, severely dilated LV, mildly decreased RV systolic function, mild MR.  She does not have a significant substance abuse history and family history does not seem suggestive of a genetic cardiomyopathy.  Diagnosis of cardiomyopathy was not around the time of pregnancy. NYHA class II symptoms, not volume overloaded on exam.  -  Continue Entresto 97/103 bid.  - Increase Coreg to 12.5 mg bid.  - Start Farxiga 10 mg daily and can decrease Lasix to 40 qam/20 qpm.  BMET/BNP today, BMET 10 days locallly.  - Continue spironolactone 25 mg daily.  - Dr. Jenene Slicker tried to send genetic testing, but it looks like it did not result.  Given young age, reasonable to assess for genetic cardiomyopathies.  I will send [employer] gene testing for cardiomyopathies since prior genetic testing appears to have not resulted. - She does need ischemic evaluation.  I do not think we are likely to get her HR low enough for CTA (running around 100 bpm at rest).  I will arrange for RHC/LHC to assess filling pressures after med changes and also to assess for coronary disease.  We discussed risks/benefits and she agrees to plan.  - If no significant coronary disease is found, I will arrange for cardiac MRI to assess for infiltrative disease/myocarditis.  2. HTN: BP mildly elevated.  Med adjustment as above.  In the future, would be reasonable to assess for OSA.   Followup 6-8 wks with me.   Marca Ancona 01/26/2023

## 2023-01-26 NOTE — H&P (View-Only) (Signed)
PCP: Darryll Capers, PA-C Cardiology: Dr. Jenene Slicker HF Cardiology: Dr. Shirlee Latch  36 y.o. with history of HTN and chronic systolic CHF was referred to heart failure clinic by Dr. Jenene Slicker for evaluation of cardiomyopathy of uncertain etiology.  She developed dyspnea and was hospitalized in [place] in 4/22.  Echo at that time showed EF of 35%.  She was seen in the ER at [facility] in 12/23 with chest pain, dyspnea, and elevated BNP. CTA chest showed no PE.  She was subsequently referred to Dr. Jenene Slicker in [place]. She was started on Lasix and GDMT. Repeat echo in 7/24 showed EF 25-30%, global hypokinesis, severely dilated LV, mildly decreased RV systolic function, mild MR.   Patient is symptomatically doing well.  She is working 2 jobs.  Dyspnea only with heavy exertion such as walking fast up stairs.  Able to do her work without problems. No orthopnea/PND.  No palpitations.  Rare atypical chest pain.  No lightheadedness.  BP runs mildly high.  Worried because her EF is still low. Patient does not drink or smoke, occasionally uses marijuana but no other drugs.  She does not seem to have a strong family history of CAD or cardiomyopathy.  The diagnosis of CHF does not seem to be related to a pregnancy (has one child).   Labs (8/24): BNP 62, K 3.5, creatinine 0.83, hgb 13.4  ECG (personally reviewed) sinus tachy 101  PMH: 1. HTN 2. Chronic systolic CHF: Echo in [place] in 4/22 with EF 35%.   - Echo (7/24): EF 25-30%, global hypokinesis, severely dilated LV, mildly decreased RV systolic function, mild MR.  SH: Lives in [place], has 1 child.  Works 2 jobs.  No ETOH, uses occasional marijuana but no other drugs.  No tobacco.   FH: No premature CAD, cardiomyopathy, or sudden death that she knows of.   ROS: All systems reviewed and negative except as per HPI.   Current Outpatient Medications  Medication Sig Dispense Refill   albuterol (VENTOLIN HFA) 108 (90 Base) MCG/ACT inhaler  Inhale into the lungs.     diazepam (VALIUM) 10 MG tablet Take 10 mg by mouth every 6 (six) hours as needed for anxiety.     montelukast (SINGULAIR) 10 MG tablet Take 10 mg by mouth at bedtime.     sacubitril-valsartan (ENTRESTO) 97-103 MG Take 1 tablet by mouth 2 (two) times daily. 180 tablet 1   spironolactone (ALDACTONE) 25 MG tablet Take 1 tablet (25 mg total) by mouth daily. 90 tablet 3   carvedilol (COREG) 12.5 MG tablet Take 1 tablet (12.5 mg total) by mouth 2 (two) times daily. 60 tablet 3   dapagliflozin propanediol (FARXIGA) 10 MG TABS tablet Take 1 tablet (10 mg total) by mouth daily before breakfast. 30 tablet 11   furosemide (LASIX) 40 MG tablet Take 1 tablet (40 mg total) by mouth every morning AND 0.5 tablets (20 mg total) every evening.     No current facility-administered medications for this encounter.   BP (!) 142/90   Pulse (!) 111   Ht 5\' 2"  (1.575 m)   Wt 107.9 kg (237 lb 12.8 oz)   SpO2 95%   BMI 43.49 kg/m  General: NAD Neck: No JVD, no thyromegaly or thyroid nodule.  Lungs: Clear to auscultation bilaterally with normal respiratory effort. CV: Nondisplaced PMI.  Heart regular S1/S2, no S3/S4, no murmur.  No peripheral edema.  No carotid bruit.  Normal pedal pulses.  Abdomen: Soft, nontender, no hepatosplenomegaly, no distention.  Skin: Intact  without lesions or rashes.  Neurologic: Alert and oriented x 3.  Psych: Normal affect. Extremities: No clubbing or cyanosis.  HEENT: Normal.   Assessment/Plan: 1. 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  Most recent echo in 7/24 with EF 25-30%, global hypokinesis, severely dilated LV, mildly decreased RV systolic function, mild MR.  She does not have a significant substance abuse history and family history does not seem suggestive of a genetic cardiomyopathy.  Diagnosis of cardiomyopathy was not around the time of pregnancy. NYHA class II symptoms, not volume overloaded on exam.  -  Continue Entresto 97/103 bid.  - Increase Coreg to 12.5 mg bid.  - Start Farxiga 10 mg daily and can decrease Lasix to 40 qam/20 qpm.  BMET/BNP today, BMET 10 days locallly.  - Continue spironolactone 25 mg daily.  - Dr. Jenene Slicker tried to send genetic testing, but it looks like it did not result.  Given young age, reasonable to assess for genetic cardiomyopathies.  I will send [employer] gene testing for cardiomyopathies since prior genetic testing appears to have not resulted. - She does need ischemic evaluation.  I do not think we are likely to get her HR low enough for CTA (running around 100 bpm at rest).  I will arrange for RHC/LHC to assess filling pressures after med changes and also to assess for coronary disease.  We discussed risks/benefits and she agrees to plan.  - If no significant coronary disease is found, I will arrange for cardiac MRI to assess for infiltrative disease/myocarditis.  2. HTN: BP mildly elevated.  Med adjustment as above.  In the future, would be reasonable to assess for OSA.   Followup 6-8 wks with me.   Marca Ancona 01/26/2023

## 2023-01-30 ENCOUNTER — Encounter (HOSPITAL_COMMUNITY): Payer: Medicaid Other | Admitting: Cardiology

## 2023-02-06 ENCOUNTER — Telehealth (HOSPITAL_COMMUNITY): Payer: Self-pay | Admitting: *Deleted

## 2023-02-12 ENCOUNTER — Telehealth (HOSPITAL_COMMUNITY): Payer: Self-pay

## 2023-02-12 NOTE — Telephone Encounter (Signed)
Left message for patient to call office if any concerns about instructions about procedure scheduled for tomorrow

## 2023-02-13 ENCOUNTER — Encounter (HOSPITAL_COMMUNITY)
Admission: RE | Disposition: A | Discharge status: Home / Self Care | Payer: Self-pay | Source: Home / Self Care | Attending: Cardiology

## 2023-02-13 ENCOUNTER — Encounter (HOSPITAL_COMMUNITY): Payer: Self-pay | Admitting: Cardiology

## 2023-02-13 ENCOUNTER — Other Ambulatory Visit: Payer: Self-pay

## 2023-02-13 ENCOUNTER — Ambulatory Visit (HOSPITAL_COMMUNITY)
Admission: RE | Admit: 2023-02-13 | Discharge: 2023-02-13 | Disposition: A | Discharge status: Home / Self Care | Payer: Medicaid Other | Attending: Cardiology | Admitting: Cardiology

## 2023-02-13 DIAGNOSIS — I429 Cardiomyopathy, unspecified: Secondary | ICD-10-CM | POA: Insufficient documentation

## 2023-02-13 DIAGNOSIS — I110 Hypertensive heart disease with heart failure: Secondary | ICD-10-CM | POA: Insufficient documentation

## 2023-02-13 DIAGNOSIS — I5023 Acute on chronic systolic (congestive) heart failure: Secondary | ICD-10-CM

## 2023-02-13 DIAGNOSIS — I509 Heart failure, unspecified: Secondary | ICD-10-CM | POA: Diagnosis not present

## 2023-02-13 DIAGNOSIS — Z79899 Other long term (current) drug therapy: Secondary | ICD-10-CM | POA: Diagnosis not present

## 2023-02-13 DIAGNOSIS — I5020 Unspecified systolic (congestive) heart failure: Secondary | ICD-10-CM

## 2023-02-13 DIAGNOSIS — I5022 Chronic systolic (congestive) heart failure: Secondary | ICD-10-CM | POA: Insufficient documentation

## 2023-02-13 HISTORY — PX: RIGHT/LEFT HEART CATH AND CORONARY ANGIOGRAPHY: CATH118266

## 2023-02-13 LAB — POCT I-STAT EG7
Acid-base deficit: 3 mmol/L — ABNORMAL HIGH (ref 0.0–2.0)
Acid-base deficit: 3 mmol/L — ABNORMAL HIGH (ref 0.0–2.0)
Bicarbonate: 22.2 mmol/L (ref 20.0–28.0)
Bicarbonate: 22.4 mmol/L (ref 20.0–28.0)
Calcium, Ion: 1.26 mmol/L (ref 1.15–1.40)
Calcium, Ion: 1.27 mmol/L (ref 1.15–1.40)
HCT: 34 % — ABNORMAL LOW (ref 36.0–46.0)
HCT: 34 % — ABNORMAL LOW (ref 36.0–46.0)
Hemoglobin: 11.6 g/dL — ABNORMAL LOW (ref 12.0–15.0)
Hemoglobin: 11.6 g/dL — ABNORMAL LOW (ref 12.0–15.0)
O2 Saturation: 67 %
O2 Saturation: 68 %
Potassium: 4.2 mmol/L (ref 3.5–5.1)
Potassium: 4.2 mmol/L (ref 3.5–5.1)
Sodium: 141 mmol/L (ref 135–145)
Sodium: 142 mmol/L (ref 135–145)
TCO2: 23 mmol/L (ref 22–32)
TCO2: 24 mmol/L (ref 22–32)
pCO2, Ven: 39.1 mmHg — ABNORMAL LOW (ref 44–60)
pCO2, Ven: 39.5 mmHg — ABNORMAL LOW (ref 44–60)
pH, Ven: 7.361 (ref 7.25–7.43)
pH, Ven: 7.362 (ref 7.25–7.43)
pO2, Ven: 36 mmHg (ref 32–45)
pO2, Ven: 37 mmHg (ref 32–45)

## 2023-02-13 LAB — PREGNANCY, URINE: Preg Test, Ur: NEGATIVE

## 2023-02-13 SURGERY — RIGHT/LEFT HEART CATH AND CORONARY ANGIOGRAPHY
Anesthesia: LOCAL

## 2023-02-13 MED ORDER — HEPARIN SODIUM (PORCINE) 1000 UNIT/ML IJ SOLN
INTRAMUSCULAR | Status: AC
  Filled 2023-02-13: qty 10

## 2023-02-13 MED ORDER — MIDAZOLAM HCL 2 MG/2ML IJ SOLN
INTRAMUSCULAR | Status: AC
  Filled 2023-02-13: qty 2

## 2023-02-13 MED ORDER — FUROSEMIDE 40 MG PO TABS: 40.0000 mg | ORAL_TABLET | Freq: Every day | ORAL | Status: DC

## 2023-02-13 MED ORDER — FENTANYL CITRATE (PF) 100 MCG/2ML IJ SOLN
INTRAMUSCULAR | Status: DC | PRN
  Administered 2023-02-13 (×2): 25 ug via INTRAVENOUS

## 2023-02-13 MED ORDER — SODIUM CHLORIDE 0.9 % IV BOLUS
INTRAVENOUS | Status: DC | PRN
  Administered 2023-02-13: 250 mL via INTRAVENOUS

## 2023-02-13 MED ORDER — MIDAZOLAM HCL 2 MG/2ML IJ SOLN
INTRAMUSCULAR | Status: DC | PRN
  Administered 2023-02-13 (×2): 1 mg via INTRAVENOUS

## 2023-02-13 MED ORDER — SODIUM CHLORIDE 0.9% FLUSH: 3.0000 mL | Freq: Two times a day (BID) | INTRAVENOUS | Status: DC

## 2023-02-13 MED ORDER — SODIUM CHLORIDE 0.9 % IV SOLN: INTRAVENOUS | Status: DC

## 2023-02-13 MED ORDER — VERAPAMIL HCL 2.5 MG/ML IV SOLN
INTRAVENOUS | Status: AC
  Filled 2023-02-13: qty 2

## 2023-02-13 MED ORDER — ONDANSETRON HCL 4 MG/2ML IJ SOLN: 4.0000 mg | Freq: Four times a day (QID) | INTRAMUSCULAR | Status: DC | PRN

## 2023-02-13 MED ORDER — FENTANYL CITRATE (PF) 100 MCG/2ML IJ SOLN
INTRAMUSCULAR | Status: AC
  Filled 2023-02-13: qty 2

## 2023-02-13 MED ORDER — ASPIRIN 81 MG PO CHEW: 81.0000 mg | CHEWABLE_TABLET | Freq: Once | ORAL | Status: DC

## 2023-02-13 MED ORDER — HEPARIN SODIUM (PORCINE) 1000 UNIT/ML IJ SOLN
INTRAMUSCULAR | Status: DC | PRN
  Administered 2023-02-13: 5000 [IU] via INTRAVENOUS

## 2023-02-13 MED ORDER — LIDOCAINE HCL (PF) 1 % IJ SOLN
INTRAMUSCULAR | Status: AC
  Filled 2023-02-13: qty 30

## 2023-02-13 MED ORDER — SODIUM CHLORIDE 0.9 % IV SOLN: 250.0000 mL | INTRAVENOUS | Status: DC | PRN

## 2023-02-13 MED ORDER — LABETALOL HCL 5 MG/ML IV SOLN: 10.0000 mg | INTRAVENOUS | Status: DC | PRN

## 2023-02-13 MED ORDER — HYDRALAZINE HCL 20 MG/ML IJ SOLN: 10.0000 mg | INTRAMUSCULAR | Status: DC | PRN

## 2023-02-13 MED ORDER — LIDOCAINE HCL (PF) 1 % IJ SOLN
INTRAMUSCULAR | Status: DC | PRN
  Administered 2023-02-13 (×2): 2 mL via INTRADERMAL

## 2023-02-13 MED ORDER — HEPARIN (PORCINE) IN NACL 1000-0.9 UT/500ML-% IV SOLN
INTRAVENOUS | Status: DC | PRN
  Administered 2023-02-13 (×2): 500 mL

## 2023-02-13 MED ORDER — SODIUM CHLORIDE 0.9% FLUSH: 3.0000 mL | INTRAVENOUS | Status: DC | PRN

## 2023-02-13 MED ORDER — ACETAMINOPHEN 325 MG PO TABS: 650.0000 mg | ORAL_TABLET | ORAL | Status: DC | PRN

## 2023-02-13 MED ORDER — VERAPAMIL HCL 2.5 MG/ML IV SOLN
INTRAVENOUS | Status: DC | PRN
  Administered 2023-02-13: 10 mL via INTRA_ARTERIAL

## 2023-02-13 MED ORDER — IOHEXOL 350 MG/ML SOLN
INTRAVENOUS | Status: DC | PRN
  Administered 2023-02-13: 35 mL

## 2023-02-13 SURGICAL SUPPLY — 13 items
CATH 5FR JL3.5 JR4 ANG PIG MP (CATHETERS) IMPLANT
CATH BALLN WEDGE 5F 110CM (CATHETERS) IMPLANT
DEVICE RAD COMP TR BAND LRG (VASCULAR PRODUCTS) IMPLANT
GLIDESHEATH SLEND SS 6F .021 (SHEATH) IMPLANT
GUIDEWIRE INQWIRE 1.5J.035X260 (WIRE) IMPLANT
INQWIRE 1.5J .035X260CM (WIRE) ×1
KIT SYRINGE INJ CVI SPIKEX1 (MISCELLANEOUS) IMPLANT
PACK CARDIAC CATHETERIZATION (CUSTOM PROCEDURE TRAY) ×1 IMPLANT
SET ATX-X65L (MISCELLANEOUS) IMPLANT
SHEATH GLIDE SLENDER 4/5FR (SHEATH) IMPLANT
SHEATH PROBE COVER 6X72 (BAG) IMPLANT
TRANSDUCER W/STOPCOCK (MISCELLANEOUS) IMPLANT
WIRE EMERALD 3MM-J .025X260CM (WIRE) IMPLANT

## 2023-02-13 NOTE — Discharge Instructions (Addendum)
Decrease Lasix (furosemide) to 40 mg tab once daily.  Drink plenty of fluids for 48 hours and keep wrist elevated at heart level for 24 hours  Radial Site Care   This sheet gives you information about how to care for yourself after your procedure. Your health care provider may also give you more specific instructions. If you have problems or questions, contact your health care provider. What can I expect after the procedure? After the procedure, it is common to have: Bruising and tenderness at the catheter insertion area. Follow these instructions at home: Medicines Take over-the-counter and prescription medicines only as told by your health care provider. Insertion site care Follow instructions from your health care provider about how to take care of your insertion site. Make sure you: Wash your hands with soap and water before you change your bandage (dressing). If soap and water are not available, use hand sanitizer. Remove your dressing as told by your health care provider. In 24 hours Check your insertion site every day for signs of infection. Check for: Redness, swelling, or pain. Fluid or blood. Pus or a bad smell. Warmth. Do not take baths, swim, or use a hot tub until your health care provider approves. You may shower 24-48 hours after the procedure, or as directed by your health care provider. Remove the dressing and gently wash the site with plain soap and water. Pat the area dry with a clean towel. Do not rub the site. That could cause bleeding. Do not apply powder or lotion to the site. Activity   For 24 hours after the procedure, or as directed by your health care provider: Do not flex or bend the affected arm. Do not push or pull heavy objects with the affected arm. Do not drive yourself home from the hospital or clinic. You may drive 24 hours after the procedure unless your health care provider tells you not to. Do not operate machinery or power tools. Do not lift  anything that is heavier than 10 lb (4.5 kg), or the limit that you are told, until your health care provider says that it is safe.  For 4 days Ask your health care provider when it is okay to: Return to work or school. Resume usual physical activities or sports. Resume sexual activity. General instructions If the catheter site starts to bleed, raise your arm and put firm pressure on the site. If the bleeding does not stop, get help right away. This is a medical emergency. If you went home on the same day as your procedure, a responsible adult should be with you for the first 24 hours after you arrive home. Keep all follow-up visits as told by your health care provider. This is important. Contact a health care provider if: You have a fever. You have redness, swelling, or yellow drainage around your insertion site. Get help right away if: You have unusual pain at the radial site. The catheter insertion area swells very fast. The insertion area is bleeding, and the bleeding does not stop when you hold steady pressure on the area. Your arm or hand becomes pale, cool, tingly, or numb. These symptoms may represent a serious problem that is an emergency. Do not wait to see if the symptoms will go away. Get medical help right away. Call your local emergency services (911 in the [place] Do not drive yourself to the hospital. Summary After the procedure, it is common to have bruising and tenderness at the site. Follow instructions from your  health care provider about how to take care of your radial site wound. Check the wound every day for signs of infection. Do not lift anything that is heavier than 10 lb (4.5 kg), or the limit that you are told, until your health care provider says that it is safe. This information is not intended to replace advice given to you by your health care provider. Make sure you discuss any questions you have with your health care provider. Document Revised: 06/17/2017  Document Reviewed: 06/17/2017 Elsevier Patient Education  2020 [employer].

## 2023-02-13 NOTE — Interval H&P Note (Signed)
History and Physical Interval Note:  02/13/2023 10:57 AM  Robyn Hanna  has presented today for surgery, with the diagnosis of Heart Failure.  The various methods of treatment have been discussed with the patient and family. After consideration of risks, benefits and other options for treatment, the patient has consented to  Procedure(s): RIGHT/LEFT HEART CATH AND CORONARY ANGIOGRAPHY (N/A) as a surgical intervention.  The patient's history has been reviewed, patient examined, no change in status, stable for surgery.  I have reviewed the patient's chart and labs.  Questions were answered to the patient's satisfaction.     Kandee Escalante [employer]

## 2023-02-19 ENCOUNTER — Other Ambulatory Visit (HOSPITAL_COMMUNITY): Payer: Self-pay | Admitting: Cardiology

## 2023-02-19 ENCOUNTER — Telehealth (HOSPITAL_COMMUNITY): Payer: Self-pay

## 2023-02-19 NOTE — Telephone Encounter (Signed)
Called patient informed her of invate results. Has appointment with Dr. Jomarie Longs on 03/03/23, who will discuss the significance of result with her. Dr. Shirlee Latch aware

## 2023-03-03 ENCOUNTER — Telehealth: Payer: Self-pay | Admitting: Internal Medicine

## 2023-03-03 ENCOUNTER — Ambulatory Visit: Payer: Medicaid Other | Admitting: Genetic Counselor

## 2023-03-03 NOTE — Telephone Encounter (Signed)
Linda Hedges, RN      02/19/23  9:45 AM Note Called patient informed her of invate results. Has appointment with Dr. Jomarie Longs on 03/03/23, who will discuss the significance of result with her. Dr. Shirlee Latch aware

## 2023-03-03 NOTE — Telephone Encounter (Signed)
Patient is requesting call back to discuss the genetics results and to see if this is something she can receive over the phone.

## 2023-03-17 ENCOUNTER — Ambulatory Visit (HOSPITAL_COMMUNITY)
Admission: RE | Admit: 2023-03-17 | Discharge: 2023-03-17 | Disposition: A | Discharge status: Home / Self Care | Payer: Medicaid Other | Source: Ambulatory Visit | Attending: Cardiology | Admitting: Cardiology

## 2023-03-17 ENCOUNTER — Encounter (HOSPITAL_COMMUNITY): Payer: Self-pay | Admitting: Cardiology

## 2023-03-17 VITALS — BP 120/80 | HR 87 | Wt 250.6 lb

## 2023-03-17 DIAGNOSIS — I110 Hypertensive heart disease with heart failure: Secondary | ICD-10-CM | POA: Diagnosis present

## 2023-03-17 DIAGNOSIS — G4733 Obstructive sleep apnea (adult) (pediatric): Secondary | ICD-10-CM | POA: Diagnosis not present

## 2023-03-17 DIAGNOSIS — E669 Obesity, unspecified: Secondary | ICD-10-CM | POA: Diagnosis not present

## 2023-03-17 DIAGNOSIS — I5022 Chronic systolic (congestive) heart failure: Secondary | ICD-10-CM | POA: Insufficient documentation

## 2023-03-17 DIAGNOSIS — Q8719 Other congenital malformation syndromes predominantly associated with short stature: Secondary | ICD-10-CM | POA: Diagnosis not present

## 2023-03-17 DIAGNOSIS — R0683 Snoring: Secondary | ICD-10-CM | POA: Diagnosis not present

## 2023-03-17 DIAGNOSIS — I5020 Unspecified systolic (congestive) heart failure: Secondary | ICD-10-CM | POA: Diagnosis not present

## 2023-03-17 DIAGNOSIS — Z79899 Other long term (current) drug therapy: Secondary | ICD-10-CM | POA: Diagnosis not present

## 2023-03-17 LAB — BASIC METABOLIC PANEL
Anion gap: 6 (ref 5–15)
BUN: 10 mg/dL (ref 6–20)
CO2: 24 mmol/L (ref 22–32)
Calcium: 9 mg/dL (ref 8.9–10.3)
Chloride: 112 mmol/L — ABNORMAL HIGH (ref 98–111)
Creatinine, Ser: 0.54 mg/dL (ref 0.44–1.00)
GFR, Estimated: >60 mL/min (ref >60)
Glucose, Bld: 83 mg/dL (ref 70–99)
Potassium: 4.4 mmol/L (ref 3.5–5.1)
Sodium: 142 mmol/L (ref 135–145)

## 2023-03-17 LAB — HEMOGLOBIN AND HEMATOCRIT, BLOOD
HCT: 38.6 % (ref 36.0–46.0)
Hemoglobin: 12.8 g/dL (ref 12.0–15.0)

## 2023-03-17 LAB — BRAIN NATRIURETIC PEPTIDE: B Natriuretic Peptide: 60.1 pg/mL (ref 0.0–100.0)

## 2023-03-17 MED ORDER — CARVEDILOL 12.5 MG PO TABS: 18.7500 mg | ORAL_TABLET | Freq: Two times a day (BID) | ORAL | 3 refills | Status: DC

## 2023-03-17 NOTE — Patient Instructions (Signed)
INCREASE Carvedilol to 18.75 mg Twice daily  Labs done today, your results will be available in MyChart, we will contact you for abnormal readings.  Your provider has ordered an inlab sleep study for you.  Your physician has requested that you have a cardiac MRI. Cardiac MRI uses a computer to create images of your heart as its beating, producing both still and moving pictures of your heart and major blood vessels. For further information please visit [email or web address]. Please follow the instruction sheet given to you today for more information. ONCE APPROVED BY YOUR INSURANCE COMPANY YOU WILL BE CALLED TO HAVE THE TEST ARRANGED.  You have been referred to the [facility] for Semaglutide.  They will call you to arrange your appointment.  Your physician recommends that you schedule a follow-up appointment in: 6 weeks   If you have any questions or concerns before your next appointment please send [place] a message through [place] or call our office at [phone].    TO LEAVE A MESSAGE FOR THE NURSE SELECT OPTION 2, PLEASE LEAVE A MESSAGE INCLUDING: YOUR NAME DATE OF BIRTH CALL BACK NUMBER REASON FOR CALL**this is important as we prioritize the call backs  YOU WILL RECEIVE A CALL BACK THE SAME DAY AS LONG AS YOU CALL BEFORE 4:00 PM  At the [facility], you and your health needs are our priority. As part of our continuing mission to provide you with exceptional heart care, we have created designated Provider Care Teams. These Care Teams include your primary Cardiologist (physician) and Advanced Practice Providers (APPs- Physician Assistants and Nurse Practitioners) who all work together to provide you with the care you need, when you need it.   You may see any of the following providers on your designated Care Team at your next follow up: Dr Arvilla Meres Dr Marca Ancona Dr. Dorthula Nettles Dr. Clearnce Hasten Amy Filbert Schilder, NP Robbie Lis, [place] [facility], [place] Brynda Peon, NP [place] Lee, NP Karle Plumber, PharmD   Please be sure to bring in all your medications bottles to every appointment.    Thank you for choosing [facility]

## 2023-03-17 NOTE — Progress Notes (Signed)
PCP: Darryll Capers, PA-C Cardiology: Dr. Jenene Slicker HF Cardiology: Dr. Shirlee Latch  37 y.o. with history of HTN and chronic systolic CHF was referred to heart failure clinic by Dr. Jenene Slicker for evaluation of cardiomyopathy of uncertain etiology.  She developed dyspnea and was hospitalized in [place] in 4/22.  Echo at that time showed EF of 35%.  She was seen in the ER at [facility] in 12/23 with chest pain, dyspnea, and elevated BNP. CTA chest showed no PE.  She was subsequently referred to Dr. Jenene Slicker in [place]. She was started on Lasix and GDMT. Repeat echo in 7/24 showed EF 25-30%, global hypokinesis, severely dilated LV, mildly decreased RV systolic function, mild MR.   LHC/RHC in 9/24 showed no significant CAD and normal filling pressures/preserved cardiac output.   Patient does not drink or smoke, occasionally uses marijuana but no other drugs.  She does not seem to have a strong family history of CAD or cardiomyopathy.  The diagnosis of CHF does not seem to be related to a pregnancy (has one child).   Patient returns for followup of CHF.  She is doing well symptomatically.  She works full time.  She denies exertional dyspnea or chest pain.  No orthopnea/PND.  She does snore and has some daytime sleepiness. She used to be on phentermine for weight loss but this was stopped.   Labs (8/24): BNP 62, K 3.5, creatinine 0.83, hgb 13.4  ECG (personally reviewed): NSR, nonspecific T wave flattening  PMH: 1. HTN 2. Chronic systolic CHF: Echo in [place] in 4/22 with EF 35%.   - Echo (7/24): EF 25-30%, global hypokinesis, severely dilated LV, mildly decreased RV systolic function, mild MR. - LHC/RHC (9/24): no significant CAD; mean RA 3, PA 27/7, mean PCWP 3, CI 2.61.  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  Some RIT1 mutations are associated with Noonan's syndrome.   SH: Lives in [place], has 1 child.  Works 2 jobs.  No ETOH, uses occasional  marijuana but no other drugs.  No tobacco.   FH: No premature CAD, cardiomyopathy, or sudden death that she knows of.   ROS: All systems reviewed and negative except as per HPI.   Current Outpatient Medications  Medication Sig Dispense Refill   albuterol (VENTOLIN HFA) 108 (90 Base) MCG/ACT inhaler Inhale 1-2 puffs into the lungs every 6 (six) hours as needed for wheezing or shortness of breath.     dapagliflozin propanediol (FARXIGA) 10 MG TABS tablet Take 1 tablet (10 mg total) by mouth daily before breakfast. 30 tablet 11   diazepam (VALIUM) 10 MG tablet Take 5-10 mg by mouth daily as needed for anxiety.     furosemide (LASIX) 40 MG tablet Take 1 tablet (40 mg total) by mouth daily.     LARIN 24 FE 1-20 MG-MCG(24) tablet Take 1 tablet by mouth daily.     montelukast (SINGULAIR) 10 MG tablet Take 10 mg by mouth at bedtime.     Multiple Vitamins-Minerals (ADULT ONE DAILY GUMMIES PO) Take 1 capsule by mouth daily.     sacubitril-valsartan (ENTRESTO) 97-103 MG Take 1 tablet by mouth 2 (two) times daily. 180 tablet 1   spironolactone (ALDACTONE) 25 MG tablet Take 1 tablet (25 mg total) by mouth daily. 90 tablet 3   Vitamin D, Ergocalciferol, (DRISDOL) 1.25 MG (50000 UNIT) CAPS capsule Take 50,000 Units by mouth every Monday.     carvedilol (COREG) 12.5 MG tablet Take 1.5 tablets (18.75 mg total) by mouth 2 (  two) times daily. 180 tablet 3   No current facility-administered medications for this encounter.   BP 120/80   Pulse 87   Wt 113.7 kg (250 lb 9.6 oz)   SpO2 98%   BMI 45.84 kg/m  General: NAD Neck: Thick. No JVD, no thyromegaly or thyroid nodule.  Lungs: Clear to auscultation bilaterally with normal respiratory effort. CV: Nondisplaced PMI.  Heart regular S1/S2, no S3/S4, no murmur.  No peripheral edema.  No carotid bruit.  Normal pedal pulses.  Abdomen: Soft, nontender, no hepatosplenomegaly, no distention.  Skin: Intact without lesions or rashes.  Neurologic: Alert and oriented  x 3.  Psych: Normal affect. Extremities: No clubbing or cyanosis.  HEENT: Normal.   Assessment/Plan: 1. 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  Most recent echo in 7/24 with EF 25-30%, global hypokinesis, severely dilated LV, mildly decreased RV systolic function, mild MR.  She does not have a significant substance abuse history and family history does not seem suggestive of a genetic cardiomyopathy.  Diagnosis of cardiomyopathy was not around the time of pregnancy. RHC/LHC in 9/24 showed no significant CAD, low filling pressures and preserved cardiac output.  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  Some mutations in this gene are linked to Noonan's syndrome, but her mutations is not one of the known pathogenic mutations.  NYHA class II symptoms, not volume overloaded on exam.  - Continue Entresto 97/103 bid.  - Increase Coreg to 18.75 mg bid.  - Continue Farxiga 10 mg daily.  - Continue spironolactone 25 mg daily. BMET/BNP today.  - Continue Lasix 40 mg daily.  - I will arrange for cardiac MRI to assess for infiltrative disease/myocarditis.  - I will refer her to see Dr. Sidney Ace for genetic evaluation given mutation in RIT1 gene of uncertain significance.  2. HTN: BP controlled.  3. OSA: Strongly suspect.  Snores, daytime sleepiness.  - I will arrange for sleep study.  4. Obesity: I will refer to pharmacy clinic to see if she can get semaglutide versus tirzepatide.   Followup 6 wks with APP   Marca Ancona 03/17/2023

## 2023-04-20 NOTE — Progress Notes (Signed)
PCP: Robyn Capers, PA-C Cardiology: Dr. Jenene Hanna HF Cardiology: Dr. Shirlee Hanna  37 y.o. with history of HTN and chronic systolic CHF was referred to heart failure clinic by Dr. Jenene Hanna for evaluation of cardiomyopathy of uncertain etiology.  She developed dyspnea and was hospitalized in [place] in 4/22.  Echo at that time showed EF of 35%.  She was seen in the ER at [facility] in 12/23 with chest pain, dyspnea, and elevated BNP. CTA chest showed no PE.  She was subsequently referred to Dr. Jenene Hanna in [place]. She was started on Lasix and GDMT. Repeat echo in 7/24 showed EF 25-30%, global hypokinesis, severely dilated LV, mildly decreased RV systolic function, mild MR.   LHC/RHC in 9/24 showed no significant CAD and normal filling pressures/preserved cardiac output.   Patient does not drink or smoke, occasionally uses marijuana but no other drugs.  She does not seem to have a strong family history of CAD or cardiomyopathy.  The diagnosis of CHF does not seem to be related to a pregnancy (has one child).   Patient returns for followup of CHF.  She is doing well symptomatically.  She works full time.  She denies exertional dyspnea or chest pain.  No orthopnea/PND.  She does snore and has some daytime sleepiness. She used to be on phentermine for weight loss but this was stopped.   Labs (8/24): BNP 62, K 3.5, creatinine 0.83, hgb 13.4  ECG (personally reviewed): NSR, nonspecific T wave flattening  PMH: 1. HTN 2. Chronic systolic CHF: Echo in [place] in 4/22 with EF 35%.   - Echo (7/24): EF 25-30%, global hypokinesis, severely dilated LV, mildly decreased RV systolic function, mild MR. - LHC/RHC (9/24): no significant CAD; mean RA 3, PA 27/7, mean PCWP 3, CI 2.61.  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  Some RIT1 mutations are associated with Noonan's syndrome.   SH: Lives in [place], has 1 child.  Works 2 jobs.  No ETOH, uses occasional  marijuana but no other drugs.  No tobacco.   FH: No premature CAD, cardiomyopathy, or sudden death that she knows of.   ROS: All systems reviewed and negative except as per HPI.   Current Outpatient Medications  Medication Sig Dispense Refill   albuterol (VENTOLIN HFA) 108 (90 Base) MCG/ACT inhaler Inhale 1-2 puffs into the lungs every 6 (six) hours as needed for wheezing or shortness of breath.     carvedilol (COREG) 12.5 MG tablet Take 1.5 tablets (18.75 mg total) by mouth 2 (two) times daily. 180 tablet 3   dapagliflozin propanediol (FARXIGA) 10 MG TABS tablet Take 1 tablet (10 mg total) by mouth daily before breakfast. 30 tablet 11   diazepam (VALIUM) 10 MG tablet Take 5-10 mg by mouth daily as needed for anxiety.     furosemide (LASIX) 40 MG tablet Take 1 tablet (40 mg total) by mouth daily.     LARIN 24 FE 1-20 MG-MCG(24) tablet Take 1 tablet by mouth daily.     montelukast (SINGULAIR) 10 MG tablet Take 10 mg by mouth at bedtime.     Multiple Vitamins-Minerals (ADULT ONE DAILY GUMMIES PO) Take 1 capsule by mouth daily.     sacubitril-valsartan (ENTRESTO) 97-103 MG Take 1 tablet by mouth 2 (two) times daily. 180 tablet 1   spironolactone (ALDACTONE) 25 MG tablet Take 1 tablet (25 mg total) by mouth daily. 90 tablet 3   Vitamin D, Ergocalciferol, (DRISDOL) 1.25 MG (50000 UNIT) CAPS capsule Take 50,000 Units  by mouth every Monday.     No current facility-administered medications for this visit.   There were no vitals taken for this visit. General: NAD Neck: Thick. No JVD, no thyromegaly or thyroid nodule.  Lungs: Clear to auscultation bilaterally with normal respiratory effort. CV: Nondisplaced PMI.  Heart regular S1/S2, no S3/S4, no murmur.  No peripheral edema.  No carotid bruit.  Normal pedal pulses.  Abdomen: Soft, nontender, no hepatosplenomegaly, no distention.  Skin: Intact without lesions or rashes.  Neurologic: Alert and oriented x 3.  Psych: Normal affect. Extremities: No  clubbing or cyanosis.  HEENT: Normal.   Assessment/Plan: 1. 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  Most recent echo in 7/24 with EF 25-30%, global hypokinesis, severely dilated LV, mildly decreased RV systolic function, mild MR.  She does not have a significant substance abuse history and family history does not seem suggestive of a genetic cardiomyopathy.  Diagnosis of cardiomyopathy was not around the time of pregnancy. RHC/LHC in 9/24 showed no significant CAD, low filling pressures and preserved cardiac output.  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  Some mutations in this gene are linked to Noonan's syndrome, but her mutations is not one of the known pathogenic mutations.  NYHA class II symptoms, not volume overloaded on exam.  - Continue Entresto 97/103 bid.  - Increase Coreg to 18.75 mg bid.  - Continue Farxiga 10 mg daily.  - Continue spironolactone 25 mg daily. BMET/BNP today.  - Continue Lasix 40 mg daily.  - I will arrange for cardiac MRI to assess for infiltrative disease/myocarditis.  - I will refer her to see Dr. 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  2. HTN: BP controlled.  3. OSA: Strongly suspect.  Snores, daytime sleepiness.  - I will arrange for sleep study.  4. Obesity: I will refer to pharmacy clinic to see if she can get semaglutide versus tirzepatide.   Followup 6 wks with APP   Robyn Hanna [facility] 04/20/2023

## 2023-04-28 ENCOUNTER — Ambulatory Visit (HOSPITAL_COMMUNITY)
Admission: RE | Admit: 2023-04-28 | Discharge: 2023-04-28 | Disposition: A | Discharge status: Home / Self Care | Payer: Medicaid Other | Source: Ambulatory Visit | Attending: Family Medicine | Admitting: Family Medicine

## 2023-04-28 ENCOUNTER — Encounter (HOSPITAL_COMMUNITY): Payer: Self-pay

## 2023-04-28 ENCOUNTER — Ambulatory Visit: Payer: Medicaid Other | Admitting: Internal Medicine

## 2023-04-28 VITALS — BP 104/82 | HR 80 | Wt 245.0 lb

## 2023-04-28 DIAGNOSIS — I10 Essential (primary) hypertension: Secondary | ICD-10-CM

## 2023-04-28 DIAGNOSIS — E669 Obesity, unspecified: Secondary | ICD-10-CM | POA: Insufficient documentation

## 2023-04-28 DIAGNOSIS — G4733 Obstructive sleep apnea (adult) (pediatric): Secondary | ICD-10-CM | POA: Diagnosis not present

## 2023-04-28 DIAGNOSIS — I428 Other cardiomyopathies: Secondary | ICD-10-CM | POA: Insufficient documentation

## 2023-04-28 DIAGNOSIS — I110 Hypertensive heart disease with heart failure: Secondary | ICD-10-CM | POA: Diagnosis present

## 2023-04-28 DIAGNOSIS — I5022 Chronic systolic (congestive) heart failure: Secondary | ICD-10-CM | POA: Diagnosis present

## 2023-04-28 DIAGNOSIS — Z6841 Body Mass Index (BMI) 40.0 and over, adult: Secondary | ICD-10-CM | POA: Diagnosis not present

## 2023-04-28 DIAGNOSIS — R29818 Other symptoms and signs involving the nervous system: Secondary | ICD-10-CM | POA: Diagnosis not present

## 2023-04-28 LAB — BASIC METABOLIC PANEL
Anion gap: 8 (ref 5–15)
BUN: 12 mg/dL (ref 6–20)
CO2: 23 mmol/L (ref 22–32)
Calcium: 9.1 mg/dL (ref 8.9–10.3)
Chloride: 105 mmol/L (ref 98–111)
Creatinine, Ser: 0.68 mg/dL (ref 0.44–1.00)
GFR, Estimated: >60 mL/min (ref >60)
Glucose, Bld: 89 mg/dL (ref 70–99)
Potassium: 4.3 mmol/L (ref 3.5–5.1)
Sodium: 136 mmol/L (ref 135–145)

## 2023-04-28 LAB — BRAIN NATRIURETIC PEPTIDE: B Natriuretic Peptide: 29.4 pg/mL (ref 0.0–100.0)

## 2023-04-28 NOTE — Patient Instructions (Signed)
Labs done today. We will contact you only if your labs are abnormal.  No medication changes were made. Please continue all current medications as prescribed.  Your physician recommends that you schedule a follow-up appointment in: 6 weeks. Please contact our office in January 2025 to schedule a mid January appointment appointment.   If you have any questions or concerns before your next appointment please send [place] a message through [place] or call our office at [phone].    TO LEAVE A MESSAGE FOR THE NURSE SELECT OPTION 2, PLEASE LEAVE A MESSAGE INCLUDING: YOUR NAME DATE OF BIRTH CALL BACK NUMBER REASON FOR CALL**this is important as we prioritize the call backs  YOU WILL RECEIVE A CALL BACK THE SAME DAY AS LONG AS YOU CALL BEFORE 4:00 PM   Do the following things EVERYDAY: Weigh yourself in the morning before breakfast. Write it down and keep it in a log. Take your medicines as prescribed Eat low salt foods--Limit salt (sodium) to 2000 mg per day.  Stay as active as you can everyday Limit all fluids for the day to less than 2 liters   At the [facility], you and your health needs are our priority. As part of our continuing mission to provide you with exceptional heart care, we have created designated Provider Care Teams. These Care Teams include your primary Cardiologist (physician) and Advanced Practice Providers (APPs- Physician Assistants and Nurse Practitioners) who all work together to provide you with the care you need, when you need it.   You may see any of the following providers on your designated Care Team at your next follow up: Dr Arvilla Meres Dr Marca Ancona Dr. Marcos Eke, NP Robbie Lis, [place] [facility] [place], [place] Brynda Peon, NP Karle Plumber, PharmD   Please be sure to bring in all your medications bottles to every appointment.    Thank you for choosing [facility]  ]

## 2023-05-05 ENCOUNTER — Ambulatory Visit: Payer: Medicaid Other | Attending: Genetic Counselor | Admitting: Genetic Counselor

## 2023-05-06 ENCOUNTER — Telehealth: Payer: Self-pay | Admitting: *Deleted

## 2023-05-11 ENCOUNTER — Encounter (HOSPITAL_COMMUNITY): Payer: Self-pay

## 2023-05-11 NOTE — Progress Notes (Signed)
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 lady of Native American (father) and Caucasian (mother) descent who reports being diagnosed with HF in 2023. She reports acute onset of dyspnea and chest heaviness one day while walking to the bathroom at home. Later at night while lying down she noted having continued symptoms and sought medical care the next day. She tells me that she was immediately admitted at the hospital with HF having a LVEF of 36% and fluid buildup in her lungs and around her heart.  She is now in GDMT but reports having persistently reduced EF of 24%.  Traditional Risk Factors Sumiye denies having myocarditis, ischemic heart disease, infiltrative myocardial disease (amyloidosis, sarcoidosis, hemochromatosis), infection with HIV virus, connective tissue disease (such as systemic lupus erythematosus etc.), doxorubicin therapy, alcohol or drug abuse and heart valvular disease.  She reports having HTN at age 57 and states that it is well controlled with medication.  Family History  Relation to Proband Pedigree # Current age Heart condition/age of onset Notes  Son IV.1 11 None         Brothers, 2 III.1, III.3 37, 32 None Estranged from both brothers  Nephew, nieces None           Father II.4 71 None   Paternal aunts, 2 II.2-II.3 60s None   Paternal uncle II.1 Deceased None Died of brain cancer @ 29  Paternal grandfather 1.1 Deceased None Died @ ? of ?  Paternal grandmother I.2 Deceased None Died @ ? of ?        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 This note also includes the re-interpretation of her genetic test result.  The variant was re-interpreted to verify the test report interpretation and to compile the information in light of the patient's clinical presentation and family history  RIT1 gene (c.13A>T, p.Thr5Ser) RIT1 (Ras like without CAAX1) gene encodes RIT1 protein, a member of a subfamily of Ras-related GTPases.  RIT1 gene has a definitive association with Noonan syndrome (NS) accounting for 5% of NS mutations. Patients with RIT1 gene mutation have a high rate of abnormal prenatal examination findings with typical NS craniofacial deformities, some have short stature and motor development disorders. HCM was common in these patients with some having supraventricular arrhythmias.   Explained to the patient that the RIT1 c.13A>T, p.Thr5Ser variant has not been reported in literature in patients with NS or cardiomyopathy. This is a very rare variant but computational algorithms predict a benign effect on protein function. As additional data is needed to definitively classify this as a benign variant, it has been classified as a Variant of unknown significance. She verbalized understanding.  Impression and Plan Therefore, as RIT1 gene variant, c.13A>T, p.Thr5Ser is not reported in patients with cardiomyopathy or Noonan  syndrome, and do not have enough evidence to be classified as pathogenic/benign, it is interpreted as variant of unknown clinical significance. It is likely that this will reclassified as a rare benign variant. As such, testing her son and brothers for this variant is not warranted.  Patient should also be aware of the protections afforded by  Genetic Information Non-Discrimination Act (GINA). GINA protects them from losing their employment or health insurance based on their genotype. However, these protections do not cover life insurance and disability.     Sidney Ace, Ph.D, [facility] Clinical Molecular Geneticist

## 2023-05-13 ENCOUNTER — Ambulatory Visit (HOSPITAL_COMMUNITY)
Admission: RE | Admit: 2023-05-13 | Discharge: 2023-05-13 | Disposition: A | Discharge status: Home / Self Care | Payer: Medicaid Other | Source: Ambulatory Visit | Attending: Cardiology | Admitting: Cardiology

## 2023-05-13 ENCOUNTER — Other Ambulatory Visit (HOSPITAL_COMMUNITY): Payer: Self-pay | Admitting: Cardiology

## 2023-05-13 DIAGNOSIS — I5020 Unspecified systolic (congestive) heart failure: Secondary | ICD-10-CM | POA: Insufficient documentation

## 2023-05-13 MED ORDER — GADOBUTROL 1 MMOL/ML IV SOLN
10.0000 mL | Freq: Once | INTRAVENOUS | Status: AC | PRN
  Administered 2023-05-13: 10 mL via INTRAVENOUS

## 2023-05-29 ENCOUNTER — Ambulatory Visit: Payer: Medicaid Other

## 2023-06-26 ENCOUNTER — Ambulatory Visit (HOSPITAL_COMMUNITY)
Admission: RE | Admit: 2023-06-26 | Discharge: 2023-06-26 | Disposition: A | Discharge status: Home / Self Care | Payer: Medicaid Other | Source: Ambulatory Visit | Attending: Cardiology | Admitting: Cardiology

## 2023-06-26 ENCOUNTER — Encounter (HOSPITAL_COMMUNITY): Payer: Self-pay | Admitting: Cardiology

## 2023-06-26 VITALS — BP 120/70 | HR 86 | Wt 243.0 lb

## 2023-06-26 DIAGNOSIS — R0683 Snoring: Secondary | ICD-10-CM | POA: Diagnosis not present

## 2023-06-26 DIAGNOSIS — I5022 Chronic systolic (congestive) heart failure: Secondary | ICD-10-CM | POA: Diagnosis not present

## 2023-06-26 DIAGNOSIS — Z6841 Body Mass Index (BMI) 40.0 and over, adult: Secondary | ICD-10-CM | POA: Diagnosis not present

## 2023-06-26 DIAGNOSIS — E669 Obesity, unspecified: Secondary | ICD-10-CM | POA: Diagnosis not present

## 2023-06-26 DIAGNOSIS — I5020 Unspecified systolic (congestive) heart failure: Secondary | ICD-10-CM

## 2023-06-26 DIAGNOSIS — Z79899 Other long term (current) drug therapy: Secondary | ICD-10-CM | POA: Insufficient documentation

## 2023-06-26 DIAGNOSIS — I110 Hypertensive heart disease with heart failure: Secondary | ICD-10-CM | POA: Diagnosis not present

## 2023-06-26 DIAGNOSIS — Z7984 Long term (current) use of oral hypoglycemic drugs: Secondary | ICD-10-CM | POA: Diagnosis not present

## 2023-06-26 DIAGNOSIS — I429 Cardiomyopathy, unspecified: Secondary | ICD-10-CM | POA: Diagnosis not present

## 2023-06-26 LAB — BASIC METABOLIC PANEL
Anion gap: 8 (ref 5–15)
BUN: 7 mg/dL (ref 6–20)
CO2: 24 mmol/L (ref 22–32)
Calcium: 9.2 mg/dL (ref 8.9–10.3)
Chloride: 108 mmol/L (ref 98–111)
Creatinine, Ser: 0.63 mg/dL (ref 0.44–1.00)
GFR, Estimated: >60 mL/min (ref >60)
Glucose, Bld: 92 mg/dL (ref 70–99)
Potassium: 4 mmol/L (ref 3.5–5.1)
Sodium: 140 mmol/L (ref 135–145)

## 2023-06-26 LAB — BRAIN NATRIURETIC PEPTIDE: B Natriuretic Peptide: 98.7 pg/mL (ref 0.0–100.0)

## 2023-06-26 MED ORDER — CARVEDILOL 25 MG PO TABS: 25.0000 mg | ORAL_TABLET | Freq: Two times a day (BID) | ORAL | 3 refills | Status: DC

## 2023-06-26 NOTE — Patient Instructions (Addendum)
INCREASE Carvedilol to 25 mg Twice daily  Labs done today, your results will be available in MyChart, we will contact you for abnormal readings.  You have been referred to the Electrophysiologist in [place]. They will call you to arrange your appointment.  Your physician recommends that you schedule a follow-up appointment in: 4 months.  If you have any questions or concerns before your next appointment please send [place] a message through [place] or call our office at [phone].    TO LEAVE A MESSAGE FOR THE NURSE SELECT OPTION 2, PLEASE LEAVE A MESSAGE INCLUDING: YOUR NAME DATE OF BIRTH CALL BACK NUMBER REASON FOR CALL**this is important as we prioritize the call backs  YOU WILL RECEIVE A CALL BACK THE SAME DAY AS LONG AS YOU CALL BEFORE 4:00 PM  At the [facility], you and your health needs are our priority. As part of our continuing mission to provide you with exceptional heart care, we have created designated Provider Care Teams. These Care Teams include your primary Cardiologist (physician) and Advanced Practice Providers (APPs- Physician Assistants and Nurse Practitioners) who all work together to provide you with the care you need, when you need it.   You may see any of the following providers on your designated Care Team at your next follow up: Dr Arvilla Meres Dr Marca Ancona Dr. Dorthula Nettles Dr. Clearnce Hasten Amy Filbert Schilder, NP Robbie Lis, [place] [facility] [place], [place] Brynda Peon, NP [place], NP Karle Plumber, PharmD   Please be sure to bring in all your medications bottles to every appointment.    Thank you for choosing  [facility]

## 2023-06-28 NOTE — Progress Notes (Signed)
PCP: [facility] Medical Cardiology: Dr. Jenene Slicker HF Cardiology: Dr. Shirlee Latch  38 y.o. with history of HTN and chronic systolic CHF was referred to heart failure clinic by Dr. Jenene Slicker for evaluation of cardiomyopathy of uncertain etiology.  She developed dyspnea and was hospitalized in [place] in 4/22.  Echo at that time showed EF of 35%.  She was seen in the ER at [facility] in 12/23 with chest pain, dyspnea, and elevated BNP. CTA chest showed no PE.  She was subsequently referred to Dr. Jenene Slicker in [place]. She was started on Lasix and GDMT. Repeat echo in 7/24 showed EF 25-30%, global hypokinesis, severely dilated LV, mildly decreased RV systolic function, mild MR.   LHC/RHC in 9/24 showed no significant CAD and normal filling pressures/preserved cardiac output.   Patient does not drink or smoke, occasionally uses marijuana but no other drugs.  She does not seem to have a strong family history of CAD or cardiomyopathy.  The diagnosis of CHF does not seem to be related to a pregnancy (has one child).    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  Some mutations in this gene are linked to Noonan's syndrome, but her mutations is not one of the known pathogenic mutations.  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    Cardiac MRI in 12/24 showed EF 30%, mild LVH, RV EF 35%, no LGE.   Today she returns for HF follow up. Weight down 2 lbs. No dyspnea with usual activities.  Working full time.  Mild dyspnea walking fast up stairs.  Rare atypical chest pain.  No orthopnea/PND.  No lightheadedness. She snores and has some daytime sleepiness.   Labs (8/24): BNP 62, K 3.5, creatinine 0.83, hgb 13.4 Labs (10/24): K 4.4, creatinine 0.54 Labs (12/24): BNP 29, K 4.9, creatinine 0.68  ECG (personally reviewed): NSR, inferior Qs  PMH: 1. HTN 2. Chronic systolic CHF: Echo in [place] in 4/22 with EF 35%.   - Echo (7/24): EF 25-30%, global  hypokinesis, severely dilated LV, mildly decreased RV systolic function, mild MR. - LHC/RHC (9/24): no significant CAD; mean RA 3, PA 27/7, mean PCWP 3, CI 2.61.  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  Some RIT1 mutations are associated with Noonan's syndrome.  Seen by Sidney Ace, probably benign gene mutation.  - Cardiac MRI (12/24): EF 30%, mild LVH, RV EF 35%, no LGE.  SH: Lives in [place], has 1 child.  Works 2 jobs.  No ETOH, uses occasional marijuana but no other drugs.  No tobacco.   FH: No premature CAD, cardiomyopathy, or sudden death that she knows of.   ROS: All systems reviewed and negative except as per HPI.   Current Outpatient Medications  Medication Sig Dispense Refill   albuterol (VENTOLIN HFA) 108 (90 Base) MCG/ACT inhaler Inhale 1-2 puffs into the lungs every 6 (six) hours as needed for wheezing or shortness of breath.     dapagliflozin propanediol (FARXIGA) 10 MG TABS tablet Take 1 tablet (10 mg total) by mouth daily before breakfast. 30 tablet 11   diazepam (VALIUM) 10 MG tablet Take 10 mg by mouth daily.     furosemide (LASIX) 40 MG tablet Take 1 tablet (40 mg total) by mouth daily.     LARIN 24 FE 1-20 MG-MCG(24) tablet Take 1 tablet by mouth daily.     montelukast (SINGULAIR) 10 MG tablet Take 10 mg by mouth at bedtime.  Multiple Vitamins-Minerals (ADULT ONE DAILY GUMMIES PO) Take 1 capsule by mouth daily.     sacubitril-valsartan (ENTRESTO) 97-103 MG Take 1 tablet by mouth 2 (two) times daily. 180 tablet 1   spironolactone (ALDACTONE) 25 MG tablet Take 1 tablet (25 mg total) by mouth daily. 90 tablet 3   Vitamin D, Ergocalciferol, (DRISDOL) 1.25 MG (50000 UNIT) CAPS capsule Take 50,000 Units by mouth every Monday.     WEGOVY 0.25 MG/0.5ML SOAJ Inject 0.25 mg into the skin once a week.     carvedilol (COREG) 25 MG tablet Take 1 tablet (25 mg total) by mouth 2 (two) times daily. 180 tablet 3   No current facility-administered  medications for this encounter.   Wt Readings from Last 3 Encounters:  06/26/23 110.2 kg (243 lb)  04/28/23 111.1 kg (245 lb)  03/17/23 113.7 kg (250 lb 9.6 oz)   BP 120/70   Pulse 86   Wt 110.2 kg (243 lb)   SpO2 97%   BMI 44.45 kg/m  Physical Exam General: NAD Neck: Thick. No JVD, no thyromegaly or thyroid nodule.  Lungs: Clear to auscultation bilaterally with normal respiratory effort. CV: Nondisplaced PMI.  Heart regular S1/S2, no S3/S4, no murmur.  No peripheral edema.  No carotid bruit.  Normal pedal pulses.  Abdomen: Soft, nontender, no hepatosplenomegaly, no distention.  Skin: Intact without lesions or rashes.  Neurologic: Alert and oriented x 3.  Psych: Normal affect. Extremities: No clubbing or cyanosis.  HEENT: Normal.   Assessment/Plan: 1. 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  Most recent echo in 7/24 with EF 25-30%, global hypokinesis, severely dilated LV, mildly decreased RV systolic function, mild MR.  She does not have a significant substance abuse history and family history does not seem suggestive of a genetic cardiomyopathy.  Diagnosis of cardiomyopathy was not around the time of pregnancy. RHC/LHC in 9/24 showed no significant CAD, low filling pressures and preserved cardiac output.  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  Some mutations in this gene are linked to Noonan's syndrome, but her mutations is not one of the known pathogenic mutations.  She saw Sidney Ace who thought this was a benign mutation.  Cardiac MRI in 12/24 showed  EF 30%, mild LVH, RV EF 35%, no LGE.  She is not volume overloaded on exam, NYHA class II symptoms.  - Continue Entresto 97/103 bid.  - Increase Coreg to 25 mg bid.  - Continue Farxiga 10 mg daily.  - Continue spironolactone 25 mg daily. BMET/BNP today.  - Continue Lasix 40 mg daily.  - Refer to EP for ICD given young age and persistently low EF. Narrow  QRS, not CRT candidate.  2. HTN: BP controlled.  - Continue current meds. 3. OSA: Strongly suspect.  Snores, daytime sleepiness.  - Arrange for sleep study.  4. Obesity: Body mass index is 44.45 kg/m. - She has started semaglutide.  Follow up in 4 months with APP.   I spent 31 minutes reviewing records, interviewing/examining patient, and managing orders.   Marca Ancona  06/28/2023

## 2023-08-13 ENCOUNTER — Encounter: Payer: Self-pay | Admitting: *Deleted

## 2023-08-13 ENCOUNTER — Ambulatory Visit: Attending: Cardiology | Admitting: Cardiology

## 2023-08-13 ENCOUNTER — Encounter: Payer: Self-pay | Admitting: Cardiology

## 2023-08-13 VITALS — BP 102/76 | HR 80 | Ht 62.0 in | Wt 239.0 lb

## 2023-08-13 DIAGNOSIS — I5022 Chronic systolic (congestive) heart failure: Secondary | ICD-10-CM | POA: Diagnosis not present

## 2023-08-13 DIAGNOSIS — Z01812 Encounter for preprocedural laboratory examination: Secondary | ICD-10-CM | POA: Diagnosis not present

## 2023-08-13 DIAGNOSIS — I10 Essential (primary) hypertension: Secondary | ICD-10-CM

## 2023-08-13 LAB — CBC

## 2023-08-13 NOTE — Patient Instructions (Signed)
 Medication Instructions:  Your physician recommends that you continue on your current medications as directed. Please refer to the Current Medication list given to you today.     * If you need a refill on your cardiac medications before your next appointment, please call your pharmacy. *   Labwork: Pre procedure lab work today: BMET & CBC  If you have labs (blood work) drawn today and your tests are completely normal, you will receive your results only by: [employer] (if you have MyChart) OR A paper copy in the mail If you have any lab test that is abnormal or we need to change your treatment, we will call you to review the results.   Testing/Procedures: Your physician has recommended that you have a defibrillator inserted. An implantable cardioverter defibrillator (ICD) is a small device that is placed in your chest or, in rare cases, your abdomen. This device uses electrical pulses or shocks to help control life-threatening, irregular heartbeats that could lead the heart to suddenly stop beating (sudden cardiac arrest). Leads are attached to the ICD that goes into your heart. This is done in the hospital and usually requires an overnight stay.  You will be scheduled for 09/11/23, arrive at 12:30 pm.  Please see instruction letter given to you today.     Follow-Up: You will be scheduled for a 2 week wound check and 3 month physician check.   Thank you for choosing [facility]!!   Dory Horn, RN [phone]   Other Instructions    Cardioverter Defibrillator Implantation An implantable cardioverter defibrillator (ICD) is a small, lightweight, battery-powered device that is placed (implanted) under the skin in the chest or abdomen. Your caregiver may prescribe an ICD if: You have had an irregular heart rhythm (arrhythmia) that originated in the lower chambers of the heart (ventricles). Your heart has been damaged by a disease (such as coronary artery disease) or heart  condition (such as a heart attack). An ICD consists of a battery that lasts several years, a small computer called a pulse generator, and wires called leads that go into the heart. It is used to detect and correct two dangerous arrhythmias: a rapid heart rhythm (tachycardia) and an arrhythmia in which the ventricles contract in an uncoordinated way (fibrillation). When an ICD detects tachycardia, it sends an electrical signal to the heart that restores the heartbeat to normal (cardioversion). This signal is usually painless. If cardioversion does not work or if the ICD detects fibrillation, it delivers a small electrical shock to the heart (defibrillation) to restart the heart. The shock may feel like a strong jolt in the chest. ICDs may be programmed to correct other problems. Sometimes, ICDs are programmed to act as another type of implantable device called a pacemaker. Pacemakers are used to treat a slow heartbeat (bradycardia). LET YOUR CAREGIVER KNOW ABOUT: Any allergies you have. All medicines you are taking, including vitamins, herbs, eyedrops, and over-the-counter medicines and creams. Previous problems you or members of your family have had with the use of anesthetics. Any blood disorders you have had. Other health problems you have. RISKS AND COMPLICATIONS Generally, the procedure to implant an ICD is safe. However, as with any surgical procedure, complications can occur. Possible complications associated with implanting an ICD include: Swelling, bleeding, or bruising at the site where the ICD was implanted. Infection at the site where the ICD was implanted. A reaction to medicine used during the procedure. Nerve, heart, or blood vessel damage. Blood clots. BEFORE THE  PROCEDURE You may need to have blood tests, heart tests, or a chest X-ray done before the day of the procedure. Ask your caregiver about changing or stopping your regular medicines. Make plans to have someone drive you home.  You may need to stay in the hospital overnight after the procedure. Stop smoking at least 24 hours before the procedure. Take a bath or shower the night before the procedure. You may need to scrub your chest or abdomen with a special type of soap. Do not eat or drink before your procedure for as long as directed by your caregiver. Ask if it is okay to take any needed medicine with a small sip of water. PROCEDURE  The procedure to implant an ICD in your chest or abdomen is usually done at a hospital in a room that has a large X-ray machine called a fluoroscope. The machine will be above you during the procedure. It will help your caregiver see your heart during the procedure. Implanting an ICD usually takes 1-3 hours. Before the procedure:  Small monitors will be put on your body. They will be used to check your heart, blood pressure, and oxygen level. A needle will be put into a vein in your hand or arm. This is called an intravenous (IV) access tube. Fluids and medicine will flow directly into your body through the IV tube. Your chest or abdomen will be cleaned with a germ-killing (antiseptic) solution. The area may be shaved. You may be given medicine to help you relax (sedative). You will be given a medicine called a local anesthetic. This medicine will make the surgical site numb while the ICD is implanted. You will be sleepy but awake during the procedure. After you are numb the procedure will begin. The caregiver will: Make a small cut (incision). This will make a pocket deep under your skin that will hold the pulse generator. Guide the leads through a large blood vessel into your heart and attach them to the heart muscles. Depending on the ICD, the leads may go into one ventricle or they may go to both ventricles and into an upper chamber of the heart (atrium). Test the ICD. Close the incision with stitches, glue, or staples. AFTER THE PROCEDURE You may feel pain. Some pain is normal. It may  last a few days. You may stay in a recovery area until the local anesthetic has worn off. Your blood pressure and pulse will be checked often. You will be taken to a room where your heart will be monitored. A chest X-ray will be taken. This is done to check that the cardioverter defibrillator is in the right place. You may stay in the hospital overnight. A slight bump may be seen over the skin where the ICD was placed. Sometimes, it is possible to feel the ICD under the skin. This is normal. In the months and years afterward, your caregiver will check the device, the leads, and the battery every few months. Eventually, when the battery is low, the ICD will be replaced.   This information is not intended to replace advice given to you by your health care provider. Make sure you discuss any questions you have with your health care provider.   Document Released: 02/01/2002 Document Revised: 03/02/2013 Document Reviewed: 05/31/2012 Elsevier Interactive Patient Education 2016 [employer]    Cardioverter Defibrillator Implantation, Care After This sheet gives you information about how to care for yourself after your procedure. Your health care provider may also give you  more specific instructions. If you have problems or questions, contact your health care provider. What can I expect after the procedure? After the procedure, it is common to have: Some pain. It may last a few days. A slight bump over the skin where the device was placed. Sometimes, it is possible to feel the device under the skin. This is normal.  During the months and years after your procedure, your health care provider will check the device, the leads, and the battery every few months. Eventually, when the battery is low, the device will be replaced. Follow these instructions at home: Medicines Take over-the-counter and prescription medicines only as told by your health care provider. If you were prescribed an antibiotic  medicine, take it as told by your health care provider. Do not stop taking the antibiotic even if you start to feel better. Incision care  Follow instructions from your health care provider about how to take care of your incision area. Make sure you: Wash your hands with soap and water before you change your bandage (dressing). If soap and water are not available, use hand sanitizer. Change your dressing as told by your health care provider. Leave stitches (sutures), skin glue, or adhesive strips in place. These skin closures may need to stay in place for 2 weeks or longer. If adhesive strip edges start to loosen and curl up, you may trim the loose edges. Do not remove adhesive strips completely unless your health care provider tells you to do that. Check your incision area every day for signs of infection. Check for: More redness, swelling, or pain. More fluid or blood. Warmth. Pus or a bad smell. Do not use lotions or ointments near the incision area unless told by your health care provider. Keep the incision area clean and dry for 2-3 days after the procedure or for as long as told by your health care provider. It takes several weeks for the incision site to heal completely. Do not take baths, swim, or use a hot tub until your health care provider approves. Activity Try to walk a little every day. Exercising is important after this procedure. Also, use your shoulder on the side of the defibrillator in daily tasks that do not require a lot of motion. For at least 6 weeks: Do not lift your upper arm above your shoulders. This means no tennis, golf, or swimming for this period of time. If you tend to sleep with your arm above your head, use a restraint to prevent this during sleep. Avoid sudden jerking, pulling, or chopping movements that pull your upper arm far away from your body. Ask your health care provider when you may go back to work. Check with your health care provider before you start  to drive or play sports. Electric and magnetic fields Tell all health care providers that you have a defibrillator. This may prevent them from giving you an MRI scan because strong magnets are used for that test. If you must pass through a metal detector, quickly walk through it. Do not stop under the detector, and do not stand near it. Avoid places or objects that have a strong electric or magnetic field, including: Scientist, physiological. At the airport, let officials know that you have a defibrillator. Your defibrillator ID card will let you be checked in a way that is safe for you and will not damage your defibrillator. Also, do not let a security person wave a magnetic wand near your defibrillator. That can make  it stop working. Power plants. Large electrical generators. Anti-theft systems or electronic article surveillance (EAS). Radiofrequency transmission towers, such as cell phone and radio towers. Do not use amateur (ham) radio equipment or electric (arc) welding torches. Some devices are safe to use if held at least 12 inches (30 cm) from your defibrillator. These include power tools, lawn mowers, and speakers. If you are unsure if something is safe to use, ask your health care provider. Do not use MP3 player headphones. They have magnets. You may safely use electric blankets, heating pads, computers, and microwave ovens. When using your cell phone, hold it to the ear that is on the opposite side from the defibrillator. Do not leave your cell phone in a pocket over the defibrillator. General instructions Follow diet instructions from your health care provider, if this applies. Always keep your defibrillator ID card with you. The card should list the implant date, device model, and manufacturer. Consider wearing a medical alert bracelet or necklace. Have your defibrillator checked every 3-6 months or as often as told by your health care provider. Most defibrillators last for 4-8  years. Keep all follow-up visits as told by your health care provider. This is important for your health care provider to make sure your chest is healing the way it should. Ask your health care provider when you should come back to have your stitches or staples taken out. Contact a health care provider if: You feel one shock in your chest. You gain weight suddenly. Your legs or feet swell more than they have before. It feels like your heart is fluttering or skipping beats (heart palpitations). You have more redness, swelling, or pain around your incision. You have more fluid or blood coming from your incision. Your incision feels warm to the touch. You have pus or a bad smell coming from your incision. You have a fever. Get help right away if: You have chest pain. You feel more than one shock. You feel more short of breath than you have felt before. You feel more light-headed than you have felt before. Your incision starts to open up. This information is not intended to replace advice given to you by your health care provider. Make sure you discuss any questions you have with your health care provider. Document Released: 11/29/2004 Document Revised: 11/30/2015 Document Reviewed: 10/17/2015 Elsevier Interactive Patient Education  2018 [employer].     Supplemental Discharge Instructions for  Pacemaker/Defibrillator Patients  ACTIVITY No heavy lifting or vigorous activity with your left/right arm for 6 to 8 weeks.  Do not raise your left/right arm above your head for one week.  Gradually raise your affected arm as drawn below.           __  NO DRIVING for     ; you may begin driving on     .  WOUND CARE Keep the wound area clean and dry.  Do not get this area wet for one week. No showers for one week; you may shower on     . The tape/steri-strips on your wound will fall off; do not pull them off.  No bandage is needed on the site.  DO  NOT apply any creams, oils, or ointments  to the wound area. If you notice any drainage or discharge from the wound, any swelling or bruising at the site, or you develop a fever > 101? F after you are discharged home, call the office at once.  SPECIAL INSTRUCTIONS You are still  able to use cellular telephones; use the ear opposite the side where you have your pacemaker/defibrillator.  Avoid carrying your cellular phone near your device. When traveling through airports, show security personnel your identification card to avoid being screened in the metal detectors.  Ask the security personnel to use the hand wand. Avoid arc welding equipment, MRI testing (magnetic resonance imaging), TENS units (transcutaneous nerve stimulators).  Call the office for questions about other devices. Avoid electrical appliances that are in poor condition or are not properly grounded. Microwave ovens are safe to be near or to operate.  ADDITIONAL INFORMATION FOR DEFIBRILLATOR PATIENTS SHOULD YOUR DEVICE GO OFF: If your device goes off ONCE and you feel fine afterward, notify the device clinic nurses. If your device goes off ONCE and you do not feel well afterward, call 911. If your device goes off TWICE, call 911. If your device goes off THREE TIMES IN ONE DAY, call 911.  DO NOT DRIVE YOURSELF OR A FAMILY MEMBER WITH A DEFIBRILLATOR TO THE HOSPITAL--CALL 911.

## 2023-08-13 NOTE — Progress Notes (Unsigned)
  Electrophysiology Office Note:   Date:  08/13/2023  ID:  Candice Tobey, DOB 1985/07/26, MRN [id number]  Primary Cardiologist: Marjo Bicker, MD Primary Heart Failure: Marca Ancona, MD Electrophysiologist: Rachelann Enloe Jorja Loa, MD  {Click to update primary MD,subspecialty MD or APP then REFRESH:1}    History of Present Illness:   Robyn Hanna is a 38 y.o. female with h/o chronic systolic heart failure, hypertension, obesity seen today for  for Electrophysiology evaluation of heart failure at the request of Marca Ancona.    She developed dyspnea and was hospitalized in 2022.  Echo at the time showed an ejection fraction of 35%.  She presented to [facility] with chest pain and dyspnea and elevated BNP.  She had an echo 11/2022 that showed an ejection fraction of 25 to 30% with global hypokinesis.  She does not drink, smoke, use alcohol.  She does not have a strong family history of cardiomyopathy.  She had genetic testing which showed an abnormality of uncertain significance.  She had a cardiac MRI that showed an ejection fraction of 35% with no LGE.  She is currently on Wegovy for her obesity and has been losing weight.  Today, denies symptoms of palpitations, chest pain, shortness of breath, orthopnea, PND, lower extremity edema, claudication, dizziness, presyncope, syncope, bleeding, or neurologic sequela. The patient is tolerating medications without difficulties.  She is overall feeling well.  She has some fatigue that she feels is related to an ear infection.  Aside from that, she has no acute complaints.  Review of systems complete and found to be negative unless listed in HPI.   EP Information / Studies Reviewed:    EKG is not ordered today. EKG from 06/26/23 reviewed which showed sinus rhythm        Risk Assessment/Calculations:              Physical Exam:   VS:  BP 102/76   Pulse 80   Ht 5\' 2"  (1.575 m)   Wt 239 lb (108.4 kg)   SpO2 99%   BMI 43.71 kg/m     Wt Readings from Last 3 Encounters:  08/13/23 239 lb (108.4 kg)  06/26/23 243 lb (110.2 kg)  04/28/23 245 lb (111.1 kg)     GEN: Well nourished, well developed in no acute distress NECK: No JVD; No carotid bruits CARDIAC: Regular rate and rhythm, no murmurs, rubs, gallops RESPIRATORY:  Clear to auscultation without rales, wheezing or rhonchi  ABDOMEN: Soft, non-tender, non-distended EXTREMITIES:  No edema; No deformity   ASSESSMENT AND PLAN:    1.  Chronic systolic heart failure: Due to nonischemic cardiomyopathy.  Cardiomyopathy is of uncertain etiology.  She has had an MRI which shows no scar.  She has had genetic testing which showed an abnormality of uncertain significance.  She is on optimal medical therapy for her heart failure per heart failure cardiology with Entresto, carvedilol, Farxiga, Aldactone.  Due to persistently low ejection fraction, she would benefit from ICD therapy.  Risks and benefits have been discussed.  This include bleeding, tamponade, infection, pneumothorax, lead dislodgment, MI, death, stroke.  She understands these risks and is agreed to the procedure.  2.  Hypertension: Blood pressure well-controlled.  3.  Snoring: Being screened for obstructive sleep apnea  4.  Obesity: Patient is on the [facility]  Case discussed with primary cardiology  Follow up with EP APP in 3 months  Signed, Ravon Mortellaro Jorja Loa, MD

## 2023-08-14 LAB — CBC
Hematocrit: 40 % (ref 34.0–46.6)
Hemoglobin: 13.3 g/dL (ref 11.1–15.9)
MCH: 30.2 pg (ref 26.6–33.0)
MCHC: 33.3 g/dL (ref 31.5–35.7)
MCV: 91 fL (ref 79–97)
Platelets: 333 10*3/uL (ref 150–450)
RBC: 4.4 x10E6/uL (ref 3.77–5.28)
RDW: 13.7 % (ref 11.7–15.4)
WBC: 9.6 10*3/uL (ref 3.4–10.8)

## 2023-08-14 LAB — BASIC METABOLIC PANEL
BUN/Creatinine Ratio: 15 (ref 9–23)
BUN: 9 mg/dL (ref 6–20)
CO2: 22 mmol/L (ref 20–29)
Calcium: 8.9 mg/dL (ref 8.7–10.2)
Chloride: 107 mmol/L — ABNORMAL HIGH (ref 96–106)
Creatinine, Ser: 0.6 mg/dL (ref 0.57–1.00)
Glucose: 90 mg/dL (ref 70–99)
Potassium: 4.6 mmol/L (ref 3.5–5.2)
Sodium: 142 mmol/L (ref 134–144)
eGFR: 120 mL/min/{1.73_m2} (ref >59)

## 2023-08-18 ENCOUNTER — Telehealth (HOSPITAL_COMMUNITY): Payer: Self-pay

## 2023-08-18 ENCOUNTER — Ambulatory Visit: Payer: Medicaid Other | Admitting: Cardiovascular Disease

## 2023-08-18 NOTE — Telephone Encounter (Signed)
 Attempted to reach patient to discuss upcoming procedure, no answer. Left VM for patient to return call.

## 2023-09-02 ENCOUNTER — Other Ambulatory Visit: Payer: Self-pay | Admitting: Cardiology

## 2023-09-03 ENCOUNTER — Ambulatory Visit (HOSPITAL_BASED_OUTPATIENT_CLINIC_OR_DEPARTMENT_OTHER): Payer: Medicaid Other | Attending: Cardiology | Admitting: Cardiology

## 2023-09-03 ENCOUNTER — Encounter (HOSPITAL_BASED_OUTPATIENT_CLINIC_OR_DEPARTMENT_OTHER): Payer: Self-pay

## 2023-09-07 ENCOUNTER — Telehealth (HOSPITAL_COMMUNITY): Payer: Self-pay

## 2023-09-07 NOTE — Telephone Encounter (Signed)
 Call placed to patient to discuss upcoming procedure.   Confirmed patient is scheduled for a Implantable cardioverter defibrilator (ICD) on Friday, April 18 with Dr. Agatha Horsfall. Instructed patient to arrive at the Main Entrance A at [facility]: [address] [place], [place] [address] and check in at Admitting at 12 AM/ PM.   Labs completed  Any recent signs of acute illness or been started on antibiotics?  No Any new medications started? No Any medications to hold? Reports she has not taken [facility] in 4 weeks because the new strength is on back order. Do not restart Wegovy prior to procedure and hold Farxiga 3 days prior. Medication instructions:  On the morning of your procedure take only Carvedilol, Spironolactone, Valium* No eating or drinking after midnight prior to procedure.   The night before your procedure and the morning of your procedure scrub your neck/chest with the CHG surgical soap.   Advised of plan to go home the same day and will only stay overnight if medically necessary. You MUST have a responsible adult to drive you home and MUST be with you the first 24 hours after you arrive home.  Patient verbalized understanding to all instructions provided and agreed to proceed with procedure.

## 2023-09-09 ENCOUNTER — Telehealth (HOSPITAL_COMMUNITY): Payer: Self-pay

## 2023-09-09 DIAGNOSIS — I5022 Chronic systolic (congestive) heart failure: Secondary | ICD-10-CM

## 2023-09-09 NOTE — Telephone Encounter (Signed)
LM for pt to call back to reschedule her procedure.

## 2023-09-09 NOTE — Telephone Encounter (Signed)
 Received another call from patient who reports she has since developed a fever of 102.7 with chills and request to reschedule procedure. She will follow back up with PCP. Will update Dr. Lawana Pray and send this information to April G. to reschedule patient for a new date due to illness.

## 2023-09-09 NOTE — Telephone Encounter (Signed)
 Pt has been rescheduled for 5/7 at 4:00 PM with Dr. Lawana Pray. She will need updated labs.   I will send updated instruction letter via MyChart - pt is aware and will call with any questions or concerns.

## 2023-09-09 NOTE — Addendum Note (Signed)
 Addended by: Azel Gumina on: 09/09/2023 05:17 PM   Modules accepted: Orders

## 2023-09-09 NOTE — Telephone Encounter (Signed)
 Received a call from patient reporting that she went to the doctor on yesterday because of lower back pain. States she was ruled out for Covid, Flu and UTI. She denies any injury from heavy lifting or straining. Denies any other associated symptoms. Reports today, she is feeling a little better. Patient wanted to make sure this would not interfere with upcoming ICD placement on 4/18. Advised patient that is OK to proceed as long as she is feeling up to it and does not develop any new signs of infection. She voiced understanding.

## 2023-09-11 ENCOUNTER — Ambulatory Visit (HOSPITAL_COMMUNITY): Admission: RE | Admit: 2023-09-11 | Source: Home / Self Care | Admitting: Cardiology

## 2023-09-11 ENCOUNTER — Encounter (HOSPITAL_COMMUNITY): Admission: RE | Payer: Self-pay | Source: Home / Self Care

## 2023-09-11 SURGERY — ICD IMPLANT

## 2023-09-17 ENCOUNTER — Telehealth (HOSPITAL_COMMUNITY): Payer: Self-pay

## 2023-09-17 NOTE — Telephone Encounter (Addendum)
 Received a telephone call from patient reporting she was in the hospital from 4/17-4/23 for kidney infection and sepsis. She was discharged home, to receive antibiotic IM injections daily, with 5 days remaining, then followed by blood cultures to reevaluate if infection cleared. She would like to postpone CRT-D procedure on 09/30/23.   Dr. Lawana Pray- does pt need to wait any amount of time prior to rescheduling?

## 2023-09-17 NOTE — Telephone Encounter (Signed)
 Informed patient of provider recommendations. She stated, she would rather postpone procedure to make sure she has fully recovered, so she could safely have procedure. She would like scheduler to contact her to discuss the next available date.

## 2023-09-23 NOTE — Telephone Encounter (Signed)
 Pt's ICD Implant with Dr. Lawana Pray has been moved from 5/7 to 8/1 at 3:00 PM. She is aware that our office has moved and she will come to our new location for labs.   I will update instruction letter and send to pt via MyChart.   Sherri, She was last seen on 3/20 and per Camnitz note she was to f/u with EP APP in 3 mth but that was not scheduled.  This is her 2nd time having to reschedule. I'm thinking he may want her to be see prior to her procedure since she moved it out to August.   She also wants to make sure he is ok with her waiting until August.

## 2023-09-24 NOTE — Telephone Encounter (Signed)
 April, please let pt know ok to wait per MD>  Cassie, please schedule pt to be seen w/ EP APP prior to ICD implant 8/1

## 2023-10-10 IMAGING — XA DG WRIST COMPLETE 3+V*R*
1 series · 2 of 2 positions shown · non-contrast
Comparison: None available

CLINICAL DATA: Right scaphoid fracture, operative fixation

EXAM:
RIGHT WRIST - COMPLETE 3+ VIEW

[Series 1: unknown protocol · 0.23mm/px · 2 of 2 slices shown]
[im 1/2]
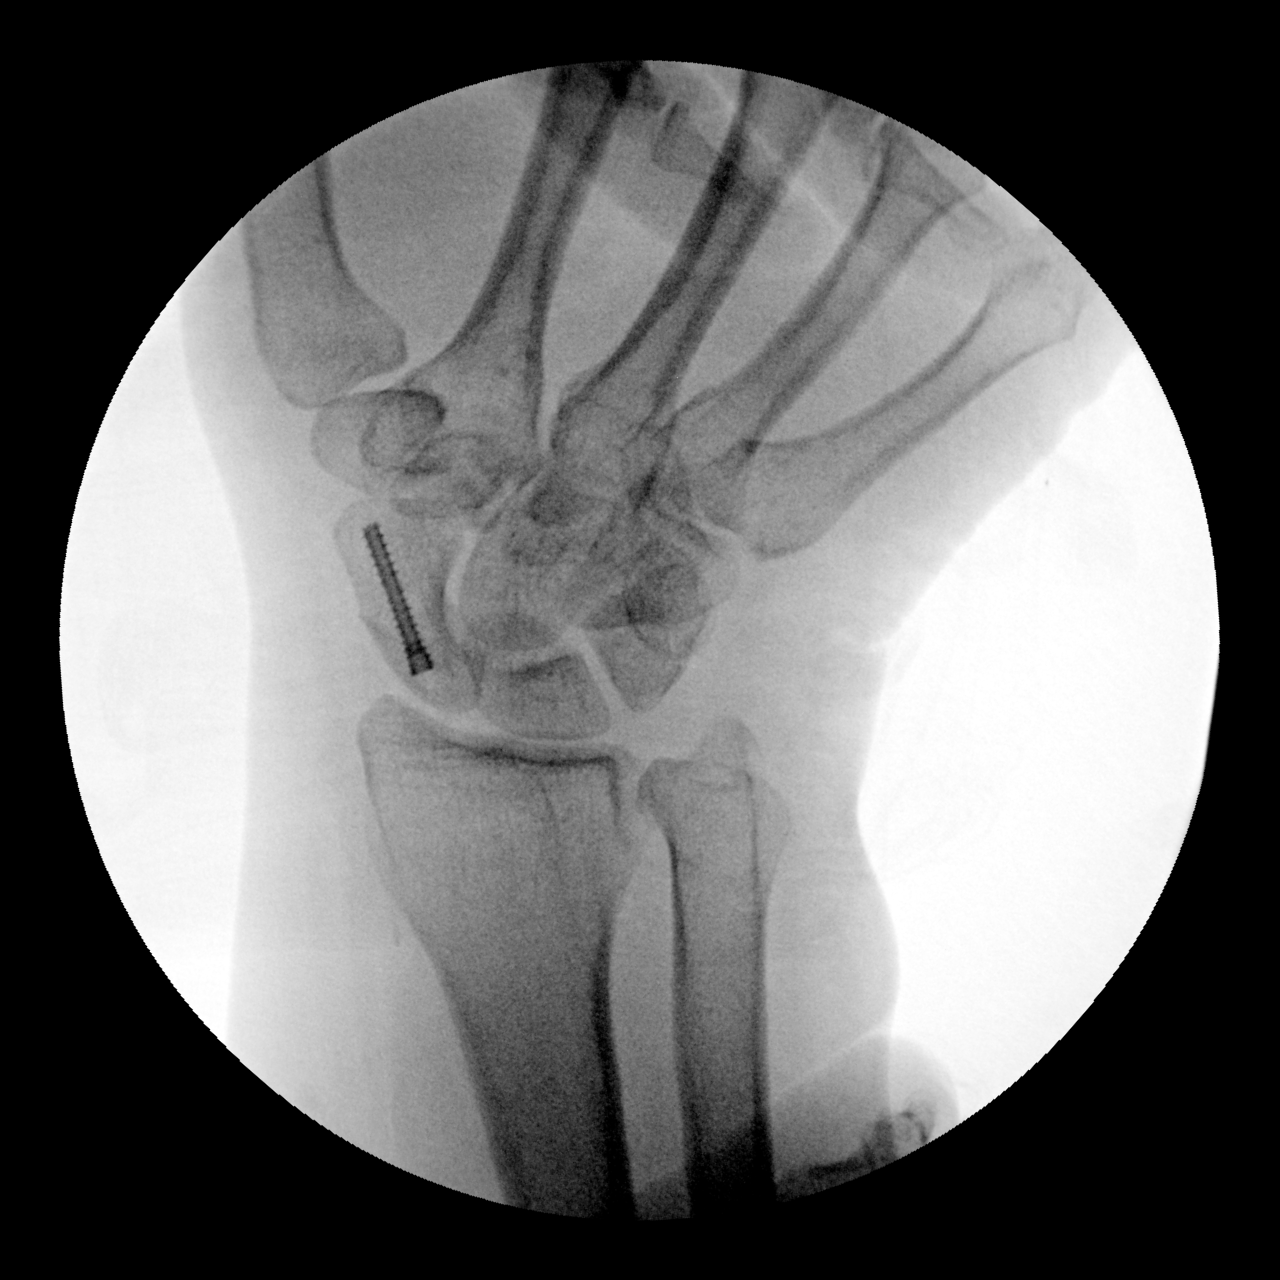
[im 2/2]
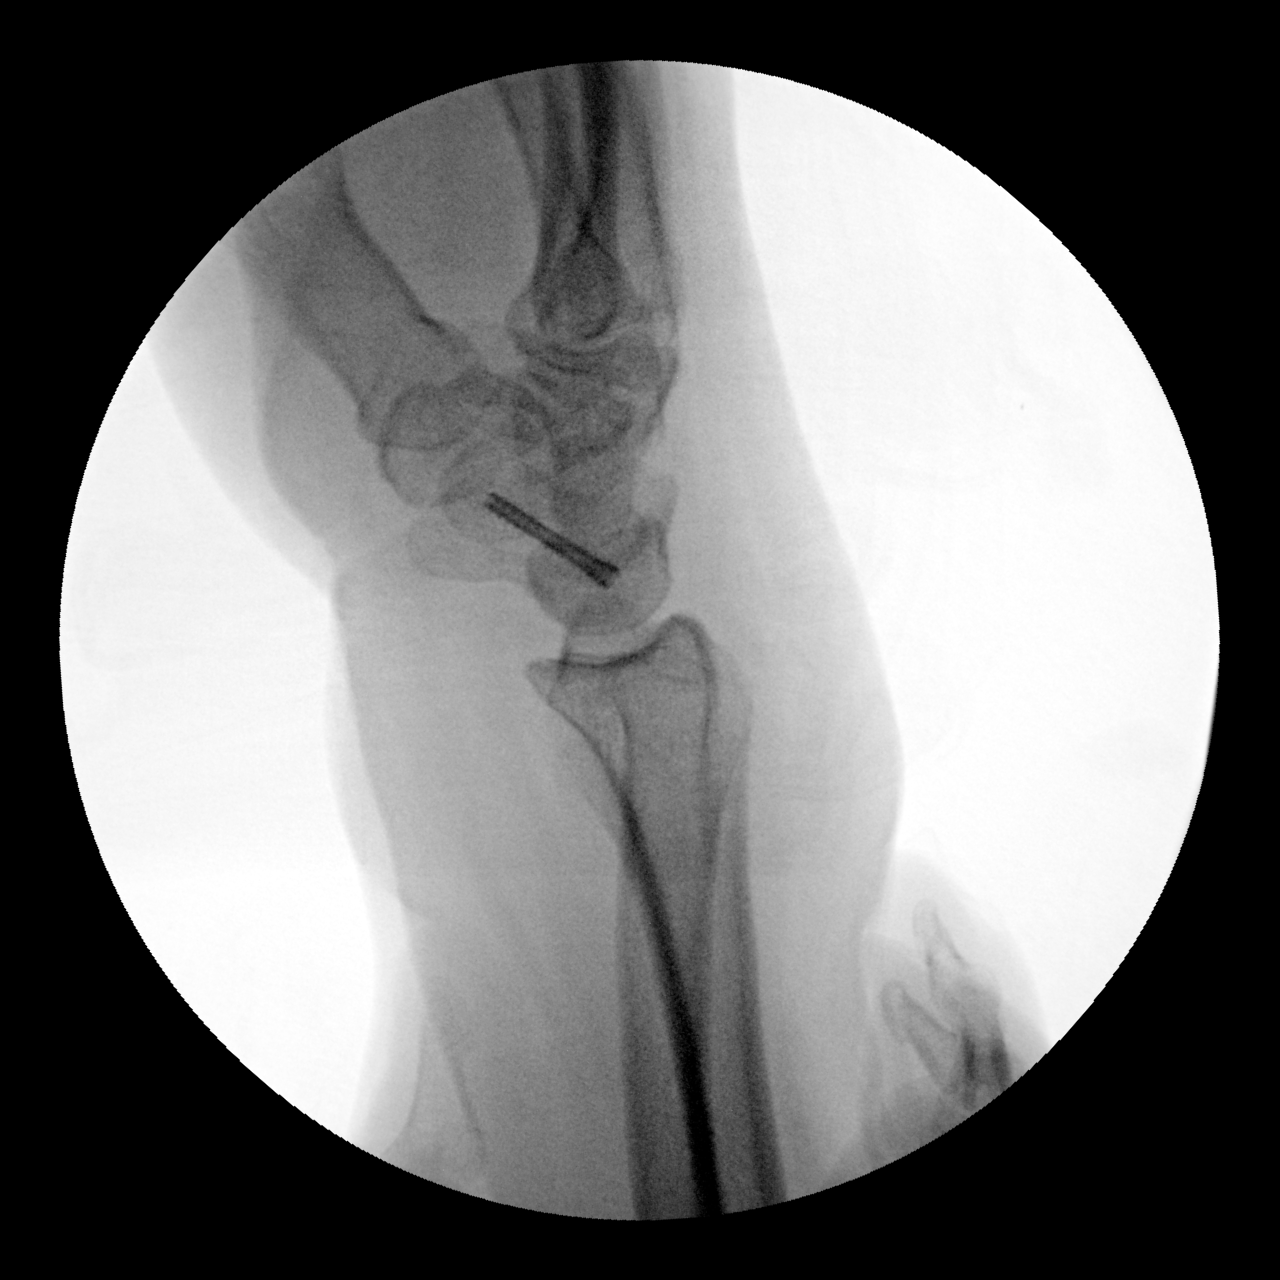

[2 of 2 positions shown; findings below may reference images not displayed]

FINDINGS: Single screw fixation traverses the scaphoid with overall anatomic
alignment. No complicating features. Wrist alignment anatomic.
Distal radius, ulna and carpal bones otherwise intact.
IMPRESSION: Status post right scaphoid fracture ORIF.

## 2023-10-16 ENCOUNTER — Telehealth: Payer: Self-pay

## 2023-10-16 NOTE — Telephone Encounter (Signed)
 Patient is scheduled for CRT-D implant with Dr. Lawana Pray on 12/25/2023 at 3pm.      Patient is scheduled to have labs drawn on 12/11/2023      Labs have been ordered and released on 09/09/2023.      Procedure instructions have been sent via MyChart per patient request.       Pre-cert message sent on 09/23/23.

## 2023-10-20 NOTE — Progress Notes (Incomplete)
 PCP: [facility] Cardiology: Dr. Arthea Larsson HF Cardiology: Dr. Mitzie Anda  38 y.o. with history of HTN and chronic systolic CHF was referred to heart failure clinic by Dr. Mallipeddi for evaluation of cardiomyopathy of uncertain etiology.  She developed dyspnea and was hospitalized in [place] in 4/22.  Echo at that time showed EF of 35%.  She was seen in the ER at [facility] in 12/23 with chest pain, dyspnea, and elevated BNP. CTA chest showed no PE.  She was subsequently referred to Dr. Arthea Larsson in [place]. She was started on Lasix  and GDMT. Repeat echo in 7/24 showed EF 25-30%, global hypokinesis, severely dilated LV, mildly decreased RV systolic function, mild MR.   LHC/RHC in 9/24 showed no significant CAD and normal filling pressures/preserved cardiac output.   Patient does not drink or smoke, occasionally uses marijuana but no other drugs.  She does not seem to have a strong family history of CAD or cardiomyopathy.  The diagnosis of CHF does not seem to be related to a pregnancy (has one child).    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  Some mutations in this gene are linked to Noonan's syndrome, but her mutations is not one of the known pathogenic mutations.  She was seen by Verdis Glade, suspect benign gene mutation.    Cardiac MRI in 12/24 showed EF 30%, mild LVH, RV EF 35%, no LGE.   Today, she returns for f/u.      Labs (8/24): BNP 62, K 3.5, creatinine 0.83, hgb 13.4 Labs (10/24): K 4.4, creatinine 0.54 Labs (12/24): BNP 29, K 4.9, creatinine 0.68 Labs (3/25): K 4.6, creatinine 0.60, Hgb 13.3  ECG (personally reviewed): ***  PMH: 1. HTN 2. Chronic systolic CHF: Echo in [place] in 4/22 with EF 35%.   - Echo (7/24): EF 25-30%, global hypokinesis, severely dilated LV, mildly decreased RV systolic function, mild MR. - LHC/RHC (9/24): no significant CAD; mean RA 3, PA 27/7, mean PCWP 3, CI 2.61.  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  Some RIT1 mutations are associated with Noonan's syndrome.  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  - Cardiac MRI (12/24): EF 30%, mild LVH, RV EF 35%, no LGE.  SH: Lives in [place], has 1 child.  Works 2 jobs.  No ETOH, uses occasional marijuana but no other drugs.  No tobacco.   FH: No premature CAD, cardiomyopathy, or sudden death that she knows of.   ROS: All systems reviewed and negative except as per HPI.   Current Outpatient Medications  Medication Sig Dispense Refill   acetaminophen  (TYLENOL ) 500 MG tablet Take 500 mg by mouth every 6 (six) hours as needed for moderate pain (pain score 4-6).     albuterol (VENTOLIN HFA) 108 (90 Base) MCG/ACT inhaler Inhale 1-2 puffs into the lungs every 6 (six) hours as needed for wheezing or shortness of breath.     carvedilol  (COREG ) 25 MG tablet Take 1 tablet (25 mg total) by mouth 2 (two) times daily. 180 tablet 3   dapagliflozin  propanediol (FARXIGA ) 10 MG TABS tablet Take 1 tablet (10 mg total) by mouth daily before breakfast. 30 tablet 11   diazepam (VALIUM) 10 MG tablet Take 10 mg by mouth daily.     furosemide  (LASIX ) 40 MG tablet Take 1 tablet (40 mg total) by mouth daily.     LARIN 24 FE 1-20 MG-MCG(24) tablet Take 1 tablet by mouth daily.  montelukast (SINGULAIR) 10 MG tablet Take 10 mg by mouth at bedtime.     Multiple Vitamins-Minerals (ADULT ONE DAILY GUMMIES PO) Take 1 capsule by mouth daily.     sacubitril -valsartan  (ENTRESTO ) 97-103 MG Take 1 tablet by mouth 2 (two) times daily. 180 tablet 1   spironolactone  (ALDACTONE ) 25 MG tablet Take 1 tablet (25 mg total) by mouth daily. 90 tablet 3   Vitamin D, Ergocalciferol, (DRISDOL) 1.25 MG (50000 UNIT) CAPS capsule Take 50,000 Units by mouth every Monday.     No current facility-administered medications for this visit.   Wt Readings from Last 3 Encounters:  08/13/23 108.4 kg (239 lb)  06/26/23 110.2 kg (243 lb)   04/28/23 111.1 kg (245 lb)   There were no vitals taken for this visit. PHYSICAL EXAM: General:  Well appearing. No respiratory difficulty HEENT: normal Neck: supple. no JVD. Carotids 2+ bilat; no bruits. No lymphadenopathy or thyromegaly appreciated. Cor: PMI nondisplaced. Regular rate & rhythm. No rubs, gallops or murmurs. Lungs: clear Abdomen: soft, nontender, nondistended. No hepatosplenomegaly. No bruits or masses. Good bowel sounds. Extremities: no cyanosis, clubbing, rash, edema Neuro: alert & oriented x 3, cranial nerves grossly intact. moves all 4 extremities w/o difficulty. Affect pleasant.    Assessment/Plan: 1. 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  Most recent echo in 7/24 with EF 25-30%, global hypokinesis, severely dilated LV, mildly decreased RV systolic function, mild MR.  She does not have a significant substance abuse history and family history does not seem suggestive of a genetic cardiomyopathy.  Diagnosis of cardiomyopathy was not around the time of pregnancy. RHC/LHC in 9/24 showed no significant CAD, low filling pressures and preserved cardiac output.  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  Some mutations in this gene are linked to Noonan's syndrome, but her mutations is not one of the known pathogenic mutations.  She saw Verdis Glade who thought this was a benign mutation.  Cardiac MRI in 12/24 showed EF 30%, mild LVH, RV EF 35%, no LGE.  NYHA Class ***. Volume *** - Continue Entresto  07-103 mg bid   - Continue Coreg  25 mg bid   - Continue Farxiga  10 mg daily  - Continue Spironolactone  25 mg daily  - Continue Lasix  40 mg daily  - Check BMP and BNP today  - Has been seen by EP. Planing ICD implant 8/25. Narrow QRS, not CRT candidate.  2. HTN: Controlled  - Continue GDMT per above  3. OSA: Strongly suspect.  Snores, daytime sleepiness. - Has been referred for sleep study, not completed  yet  4. Obesity: There is no height or weight on file to calculate BMI. - She has started semaglutide. ??   F/u ***  Ruddy Corral, PA-C  10/20/2023

## 2023-10-23 ENCOUNTER — Encounter (HOSPITAL_COMMUNITY): Payer: Medicaid Other

## 2023-12-04 ENCOUNTER — Encounter: Payer: Self-pay | Admitting: Pulmonary Disease

## 2023-12-04 ENCOUNTER — Ambulatory Visit: Attending: Pulmonary Disease | Admitting: Pulmonary Disease

## 2023-12-04 VITALS — BP 130/80 | HR 86 | Ht 62.0 in | Wt 250.0 lb

## 2023-12-04 DIAGNOSIS — I5022 Chronic systolic (congestive) heart failure: Secondary | ICD-10-CM | POA: Insufficient documentation

## 2023-12-04 DIAGNOSIS — I429 Cardiomyopathy, unspecified: Secondary | ICD-10-CM | POA: Diagnosis present

## 2023-12-04 NOTE — Progress Notes (Signed)
  Electrophysiology Office Note:   Date:  12/04/2023  ID:  Robyn Hanna, DOB [birth year], MRN [id number]  Primary Cardiologist: Vishnu P Mallipeddi, MD Primary Heart Failure: Ezra Shuck, MD Electrophysiologist: Will Gladis Norton, MD      History of Present Illness:   Robyn Hanna is a 38 y.o. female with h/o HFrEF / NICM, familial hx of cardiomyopathy, HTN, obesity seen today for routine electrophysiology followup.   Since last being seen in our clinic the patient reports she has been doing well. She has a few questions about her procedure and lab work.  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  She denies new symptoms or medication changes since her last visit.   She denies chest pain, palpitations, dyspnea, PND, orthopnea, nausea, vomiting, dizziness, syncope, edema, weight gain, or early satiety.   Review of systems complete and found to be negative unless listed in HPI.   EP Information / Studies Reviewed:    EKG is ordered today. Personal review as below.  EKG Interpretation Date/Time:  Friday December 04 2023 16:01:11 EDT Ventricular Rate:  86 PR Interval:  144 QRS Duration:  106 QT Interval:  348 QTC Calculation: 416 R Axis:   87  Text Interpretation: Normal sinus rhythm Confirmed by Aniceto Jarvis ([id number]) on 12/04/2023 4:11:29 PM   Risk Assessment/Calculations:              Physical Exam:   VS:  BP 130/80   Pulse 86   Ht 5' 2 (1.575 m)   Wt 250 lb (113.4 kg)   SpO2 98%   BMI 45.73 kg/m    Wt Readings from Last 3 Encounters:  12/04/23 250 lb (113.4 kg)  08/13/23 239 lb (108.4 kg)  06/26/23 243 lb (110.2 kg)     GEN: Well nourished, well developed in no acute distress NECK: No JVD; No carotid bruits CARDIAC: Regular rate and rhythm, no murmurs, rubs, gallops RESPIRATORY:  Clear to auscultation without rales, wheezing or rhonchi  ABDOMEN: Soft, non-tender,  non-distended EXTREMITIES:  No edema; No deformity   ASSESSMENT AND PLAN:    HFrEF / NICM  LVEF 25-30%, G2DD. LHC 01/2023 showed normal coronaries. MRI 04/2023 showed normal LV size, EF 30%, no significant delayed enhancement / no definitive evidence for prior MI, infiltrative disease or myocarditis.  -pre-procedure instructions discussed with patient, written instructions provided  -pre-procedure labs > CBC, BMP  -euvolemic on exam  -GDMT per Advanced HF Team  -discussed procedure details with patient  -limited review of post procedure care discussed   Follow up with Dr. Norton as planned for ICD placement on 12/25/23   Signed, Jarvis Aniceto, NP-C, AGACNP-BC [facility] - Electrophysiology  12/04/2023, 5:02 PM

## 2023-12-04 NOTE — H&P (View-Only) (Signed)
  Electrophysiology Office Note:   Date:  12/04/2023  ID:  Robyn Hanna, DOB [birth year], MRN [id number]  Primary Cardiologist: Vishnu P Mallipeddi, MD Primary Heart Failure: Ezra Shuck, MD Electrophysiologist: Will Gladis Norton, MD      History of Present Illness:   Robyn Hanna is a 38 y.o. female with h/o HFrEF / NICM, familial hx of cardiomyopathy, HTN, obesity seen today for routine electrophysiology followup.   Since last being seen in our clinic the patient reports she has been doing well. She has a few questions about her procedure and lab work.  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  She denies new symptoms or medication changes since her last visit.   She denies chest pain, palpitations, dyspnea, PND, orthopnea, nausea, vomiting, dizziness, syncope, edema, weight gain, or early satiety.   Review of systems complete and found to be negative unless listed in HPI.   EP Information / Studies Reviewed:    EKG is ordered today. Personal review as below.  EKG Interpretation Date/Time:  Friday December 04 2023 16:01:11 EDT Ventricular Rate:  86 PR Interval:  144 QRS Duration:  106 QT Interval:  348 QTC Calculation: 416 R Axis:   87  Text Interpretation: Normal sinus rhythm Confirmed by Aniceto Jarvis ([id number]) on 12/04/2023 4:11:29 PM   Risk Assessment/Calculations:              Physical Exam:   VS:  BP 130/80   Pulse 86   Ht 5' 2 (1.575 m)   Wt 250 lb (113.4 kg)   SpO2 98%   BMI 45.73 kg/m    Wt Readings from Last 3 Encounters:  12/04/23 250 lb (113.4 kg)  08/13/23 239 lb (108.4 kg)  06/26/23 243 lb (110.2 kg)     GEN: Well nourished, well developed in no acute distress NECK: No JVD; No carotid bruits CARDIAC: Regular rate and rhythm, no murmurs, rubs, gallops RESPIRATORY:  Clear to auscultation without rales, wheezing or rhonchi  ABDOMEN: Soft, non-tender,  non-distended EXTREMITIES:  No edema; No deformity   ASSESSMENT AND PLAN:    HFrEF / NICM  LVEF 25-30%, G2DD. LHC 01/2023 showed normal coronaries. MRI 04/2023 showed normal LV size, EF 30%, no significant delayed enhancement / no definitive evidence for prior MI, infiltrative disease or myocarditis.  -pre-procedure instructions discussed with patient, written instructions provided  -pre-procedure labs > CBC, BMP  -euvolemic on exam  -GDMT per Advanced HF Team  -discussed procedure details with patient  -limited review of post procedure care discussed   Follow up with Dr. Norton as planned for ICD placement on 12/25/23   Signed, Jarvis Aniceto, NP-C, AGACNP-BC [facility] - Electrophysiology  12/04/2023, 5:02 PM

## 2023-12-04 NOTE — Progress Notes (Incomplete)
 PCP: Melba Lamarr BRAVO, NP Cardiology: Dr. Stacia HF Cardiology: Dr. Rolan  38 y.o. with history of HTN and chronic systolic CHF was referred to heart failure clinic by Dr. Mallipeddi for evaluation of cardiomyopathy of uncertain etiology.  She developed dyspnea and was hospitalized in [place] in 4/22.  Echo at that time showed EF of 35%.  She was seen in the ER at [facility] in 12/23 with chest pain, dyspnea, and elevated BNP. CTA chest showed no PE.  She was subsequently referred to Dr. Stacia in [place]. She was started on Lasix  and GDMT. Repeat echo in 7/24 showed EF 25-30%, global hypokinesis, severely dilated LV, mildly decreased RV systolic function, mild MR.   LHC/RHC in 9/24 showed no significant CAD and normal filling pressures/preserved cardiac output.   Patient does not drink or smoke, occasionally uses marijuana but no other drugs.  She does not seem to have a strong family history of CAD or cardiomyopathy.  The diagnosis of CHF does not seem to be related to a pregnancy (has one child).    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  Some mutations in this gene are linked to Noonan's syndrome, but her mutations is not one of the known pathogenic mutations.  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    Cardiac MRI in 12/24 showed EF 30%, mild LVH, RV EF 35%, no LGE.   Today she returns for HF follow up. Weight down 2 lbs. No dyspnea with usual activities.  Working full time.  Mild dyspnea walking fast up stairs.  Rare atypical chest pain.  No orthopnea/PND.  No lightheadedness. She snores and has some daytime sleepiness.   Labs (8/24): BNP 62, K 3.5, creatinine 0.83, hgb 13.4 Labs (10/24): K 4.4, creatinine 0.54 Labs (12/24): BNP 29, K 4.9, creatinine 0.68  ECG (personally reviewed): NSR, inferior Qs  PMH: 1. HTN 2. Chronic systolic CHF: Echo in [place] in 4/22 with EF 35%.   - Echo (7/24): EF 25-30%, global  hypokinesis, severely dilated LV, mildly decreased RV systolic function, mild MR. - LHC/RHC (9/24): no significant CAD; mean RA 3, PA 27/7, mean PCWP 3, CI 2.61.  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  Some RIT1 mutations are associated with Noonan's syndrome.  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  - Cardiac MRI (12/24): EF 30%, mild LVH, RV EF 35%, no LGE.  SH: Lives in [place], has 1 child.  Works 2 jobs.  No ETOH, uses occasional marijuana but no other drugs.  No tobacco.   FH: No premature CAD, cardiomyopathy, or sudden death that she knows of.   ROS: All systems reviewed and negative except as per HPI.   Current Outpatient Medications  Medication Sig Dispense Refill   albuterol (VENTOLIN HFA) 108 (90 Base) MCG/ACT inhaler Inhale 1-2 puffs into the lungs every 6 (six) hours as needed for wheezing or shortness of breath.     carvedilol  (COREG ) 25 MG tablet Take 1 tablet (25 mg total) by mouth 2 (two) times daily. 180 tablet 3   dapagliflozin  propanediol (FARXIGA ) 10 MG TABS tablet Take 1 tablet (10 mg total) by mouth daily before breakfast. 30 tablet 11   diazepam (VALIUM) 10 MG tablet Take 10 mg by mouth daily.     furosemide  (LASIX ) 40 MG tablet Take 1 tablet (40 mg total) by mouth daily.     LARIN 24 FE 1-20 MG-MCG(24) tablet Take 1 tablet  by mouth daily.     montelukast (SINGULAIR) 10 MG tablet Take 10 mg by mouth at bedtime.     Multiple Vitamins-Minerals (ADULT ONE DAILY GUMMIES PO) Take 1 capsule by mouth daily.     sacubitril -valsartan  (ENTRESTO ) 97-103 MG Take 1 tablet by mouth 2 (two) times daily. 180 tablet 1   spironolactone  (ALDACTONE ) 25 MG tablet Take 1 tablet (25 mg total) by mouth daily. 90 tablet 3   Vitamin D, Ergocalciferol, (DRISDOL) 1.25 MG (50000 UNIT) CAPS capsule Take 50,000 Units by mouth every Monday.     No current facility-administered medications for this visit.   Wt Readings from Last 3 Encounters:   12/04/23 113.4 kg (250 lb)  08/13/23 108.4 kg (239 lb)  06/26/23 110.2 kg (243 lb)   There were no vitals taken for this visit. Physical Exam General: NAD Neck: Thick. No JVD, no thyromegaly or thyroid nodule.  Lungs: Clear to auscultation bilaterally with normal respiratory effort. CV: Nondisplaced PMI.  Heart regular S1/S2, no S3/S4, no murmur.  No peripheral edema.  No carotid bruit.  Normal pedal pulses.  Abdomen: Soft, nontender, no hepatosplenomegaly, no distention.  Skin: Intact without lesions or rashes.  Neurologic: Alert and oriented x 3.  Psych: Normal affect. Extremities: No clubbing or cyanosis.  HEENT: Normal.   Assessment/Plan: 1. 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  Most recent echo in 7/24 with EF 25-30%, global hypokinesis, severely dilated LV, mildly decreased RV systolic function, mild MR.  She does not have a significant substance abuse history and family history does not seem suggestive of a genetic cardiomyopathy.  Diagnosis of cardiomyopathy was not around the time of pregnancy. RHC/LHC in 9/24 showed no significant CAD, low filling pressures and preserved cardiac output.  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  Some mutations in this gene are linked to Noonan's syndrome, but her mutations is not one of the known pathogenic mutations.  She saw Danford Pac who thought this was a benign mutation.  Cardiac MRI in 12/24 showed  EF 30%, mild LVH, RV EF 35%, no LGE.  She is not volume overloaded on exam, NYHA class II symptoms.  - Continue Entresto  97/103 bid.  - Increase Coreg  to 25 mg bid.  - Continue Farxiga  10 mg daily.  - Continue spironolactone  25 mg daily. BMET/BNP today.  - Continue Lasix  40 mg daily.  - Refer to EP for ICD given young age and persistently low EF. Narrow QRS, not CRT candidate.  2. HTN: BP controlled.  - Continue current meds. 3. OSA: Strongly suspect.  Snores,  daytime sleepiness.  - Arrange for sleep study.  4. Obesity: There is no height or weight on file to calculate BMI. - She has started semaglutide.  Follow up in 4 months with APP.   I spent 31 minutes reviewing records, interviewing/examining patient, and managing orders.   Harlene HERO [facility]  12/04/2023

## 2023-12-04 NOTE — Patient Instructions (Signed)
 Medication Instructions:  Your physician recommends that you continue on your current medications as directed. Please refer to the Current Medication list given to you today.  *If you need a refill on your cardiac medications before your next appointment, please call your pharmacy*  Lab Work: BMET, CBC-TODAY If you have labs (blood work) drawn today and your tests are completely normal, you will receive your results only by: MyChart Message (if you have MyChart) OR A paper copy in the mail If you have any lab test that is abnormal or we need to change your treatment, we will call you to review the results.  Testing/Procedures: See letter  Follow-Up: At [facility], you and your health needs are our priority.  As part of our continuing mission to provide you with exceptional heart care, our providers are all part of one team.  This team includes your primary Cardiologist (physician) and Advanced Practice Providers or APPs (Physician Assistants and Nurse Practitioners) who all work together to provide you with the care you need, when you need it.  Your next appointment:   Follow up will be arranged for you and print out on your discharge summary after your procedure.

## 2023-12-05 LAB — BASIC METABOLIC PANEL WITH GFR
BUN/Creatinine Ratio: 27 — AB (ref 9–23)
BUN: 15 mg/dL (ref 6–20)
CO2: 17 mmol/L — AB (ref 20–29)
Calcium: 9.4 mg/dL (ref 8.7–10.2)
Chloride: 105 mmol/L (ref 96–106)
Creatinine, Ser: 0.56 mg/dL — AB (ref 0.57–1.00)
Glucose: 93 mg/dL (ref 70–99)
Potassium: 3.9 mmol/L (ref 3.5–5.2)
Sodium: 140 mmol/L (ref 134–144)
eGFR: 122 mL/min/1.73 (ref >59)

## 2023-12-05 LAB — CBC
Hematocrit: 38.9 % (ref 34.0–46.6)
Hemoglobin: 12.8 g/dL (ref 11.1–15.9)
MCH: 30 pg (ref 26.6–33.0)
MCHC: 32.9 g/dL (ref 31.5–35.7)
MCV: 91 fL (ref 79–97)
Platelets: 310 x10E3/uL (ref 150–450)
RBC: 4.27 x10E6/uL (ref 3.77–5.28)
RDW: 12.7 % (ref 11.7–15.4)
WBC: 12.1 x10E3/uL — ABNORMAL HIGH (ref 3.4–10.8)

## 2023-12-06 ENCOUNTER — Ambulatory Visit: Payer: Self-pay | Admitting: Pulmonary Disease

## 2023-12-07 ENCOUNTER — Telehealth (HOSPITAL_COMMUNITY): Payer: Self-pay | Admitting: *Deleted

## 2023-12-07 NOTE — Telephone Encounter (Signed)
 Called to confirm/remind patient of their appointment at the [facility] on 12/08/23.      Appointment:              [] Confirmed             [x] Left mess              [] No answer/No voice mail             [] Phone not in service   Patient reminded to bring all medications and/or complete list.   Confirmed patient has transportation. Gave directions, instructed to utilize valet parking.

## 2023-12-08 ENCOUNTER — Encounter (HOSPITAL_COMMUNITY)

## 2023-12-11 ENCOUNTER — Telehealth (HOSPITAL_COMMUNITY): Payer: Self-pay

## 2023-12-11 NOTE — Telephone Encounter (Signed)
 Called to confirm/remind patient of their appointment at the [facility] on 12/14/2023 3:00.   Appointment:   [x] Confirmed  [] Left mess   [] No answer/No voice mail  [] VM Full/unable to leave message  [] Phone not in service  Patient reminded to bring all medications and/or complete list.  Confirmed patient has transportation. Gave directions, instructed to utilize valet parking.

## 2023-12-14 ENCOUNTER — Inpatient Hospital Stay (HOSPITAL_COMMUNITY)
Admission: RE | Admit: 2023-12-14 | Discharge: 2023-12-14 | Disposition: A | Discharge status: Home / Self Care | Source: Ambulatory Visit

## 2023-12-14 NOTE — Progress Notes (Incomplete)
 PCP: Melba Lamarr BRAVO, NP Cardiology: Dr. Stacia HF Cardiology: Dr. Rolan  38 y.o. with history of HTN and chronic systolic CHF was referred to heart failure clinic by Dr. Mallipeddi for evaluation of cardiomyopathy of uncertain etiology.  She developed dyspnea and was hospitalized in [place] in 4/22.  Echo at that time showed EF of 35%.  She was seen in the ER at [facility] in 12/23 with chest pain, dyspnea, and elevated BNP. CTA chest showed no PE.  She was subsequently referred to Dr. Stacia in [place]. She was started on Lasix  and GDMT. Repeat echo in 7/24 showed EF 25-30%, global hypokinesis, severely dilated LV, mildly decreased RV systolic function, mild MR.   LHC/RHC in 9/24 showed no significant CAD and normal filling pressures/preserved cardiac output.   Patient does not drink or smoke, occasionally uses marijuana but no other drugs.  She does not seem to have a strong family history of CAD or cardiomyopathy.  The diagnosis of CHF does not seem to be related to a pregnancy (has one child).    Today she returns for HF follow up.Overall feeling fine. Denies SOB/PND/Orthopnea. Appetite ok. No fever or chills. Weight at home  pounds. Taking all medications  Labs (8/24): BNP 62, K 3.5, creatinine 0.83, hgb 13.4 Labs (10/24): K 4.4, creatinine 0.54   PMH: 1. HTN 2. Chronic systolic CHF: Echo in [place] in 4/22 with EF 35%.   - Echo (7/24): EF 25-30%, global hypokinesis, severely dilated LV, mildly decreased RV systolic function, mild MR. - LHC/RHC (9/24): no significant CAD; mean RA 3, PA 27/7, mean PCWP 3, CI 2.61.  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  Some RIT1 mutations are associated with Noonan's syndrome.   SH: Lives in [place], has 1 child.  Works 2 jobs.  No ETOH, uses occasional marijuana but no other drugs.  No tobacco.   FH: No premature CAD, cardiomyopathy, or sudden death that she knows of.   ROS: All systems  reviewed and negative except as per HPI.   Current Outpatient Medications  Medication Sig Dispense Refill   albuterol (VENTOLIN HFA) 108 (90 Base) MCG/ACT inhaler Inhale 1-2 puffs into the lungs every 6 (six) hours as needed for wheezing or shortness of breath.     carvedilol  (COREG ) 25 MG tablet Take 1 tablet (25 mg total) by mouth 2 (two) times daily. 180 tablet 3   dapagliflozin  propanediol (FARXIGA ) 10 MG TABS tablet Take 1 tablet (10 mg total) by mouth daily before breakfast. 30 tablet 11   diazepam (VALIUM) 10 MG tablet Take 10 mg by mouth daily.     furosemide  (LASIX ) 40 MG tablet Take 1 tablet (40 mg total) by mouth daily.     LARIN 24 FE 1-20 MG-MCG(24) tablet Take 1 tablet by mouth daily.     montelukast (SINGULAIR) 10 MG tablet Take 10 mg by mouth at bedtime.     Multiple Vitamins-Minerals (ADULT ONE DAILY GUMMIES PO) Take 1 capsule by mouth daily.     sacubitril -valsartan  (ENTRESTO ) 97-103 MG Take 1 tablet by mouth 2 (two) times daily. 180 tablet 1   spironolactone  (ALDACTONE ) 25 MG tablet Take 1 tablet (25 mg total) by mouth daily. 90 tablet 3   Vitamin D, Ergocalciferol, (DRISDOL) 1.25 MG (50000 UNIT) CAPS capsule Take 50,000 Units by mouth every Monday.     No current facility-administered medications for this visit.   Wt Readings from Last 3 Encounters:  12/04/23 113.4 kg (250 lb)  08/13/23 108.4 kg (239 lb)  06/26/23 110.2 kg (243 lb)   There were no vitals taken for this visit. General:   No resp difficulty Neck: no JVD.  Cor: Regular rate & rhythm. Lungs: clear Abdomen: soft, nontender, nondistended.  Extremities: no  edema Neuro: alert & oriented x3  Assessment/Plan: 1. 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  Most recent echo in 7/24 with EF 25-30%, global hypokinesis, severely dilated LV, mildly decreased RV systolic function, mild MR.  She does not have a significant substance abuse history and family history does not seem  suggestive of a genetic cardiomyopathy.  Diagnosis of cardiomyopathy was not around the time of pregnancy. RHC/LHC in 9/24 showed no significant CAD, low filling pressures and preserved cardiac output.  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  Some mutations in this gene are linked to Noonan's syndrome, but her mutations is not one of the known pathogenic mutations.   - NYHA   - Continue Entresto  97/103 bid.  - Continue Coreg  18.75 mg bid.  - Continue Farxiga  10 mg daily.  - Continue spironolactone  25 mg daily. BMET/BNP today.  - Continue Lasix  40/20.  - Plan for ICD next month.  - She has been referred to Dr. Danford Pac for genetic evaluation given mutation in RIT1 gene of uncertain significance.  2. HTN:  - Continue current meds. 3. OSA: Strongly suspect.  Snores, daytime sleepiness.  - Split night has been arranged. 4. Obesity: There is no height or weight on file to calculate BMI. - She has just started semaglutide.   Follow up in ***    Fernie Grimm NP-C  12/14/2023

## 2023-12-17 ENCOUNTER — Other Ambulatory Visit: Payer: Self-pay | Admitting: Internal Medicine

## 2023-12-18 ENCOUNTER — Telehealth (HOSPITAL_COMMUNITY): Payer: Self-pay

## 2023-12-18 NOTE — Telephone Encounter (Signed)
 Spoke with patient to discuss upcoming procedure.   Confirmed patient is scheduled for a Implantable cardioverter defibrilator (ICD) on Friday, August 1 with Dr. Soyla Norton. Instructed patient to arrive at the Main Entrance A at [facility]: [address] [place], [place] [address] and check in at Admitting at 10:30 AM.   Labs completed  Any recent signs of acute illness or been started on antibiotics? No Any new medications started? Phentermine  Any medications to hold? Hold Phentermine for 7 days prior to procedure- last dose on July 24. Hold Farxiga  for 3 days prior to the procedure- last dose on July 28. Hold Spironolactone  and Furosemide  on the morning of your procedure.  Medication instructions:  On the morning of your procedure take your morning medications not discussed with a sip of water.  No eating or drinking after midnight prior to procedure.   The night before your procedure and the morning of your procedure, wash thoroughly with the CHG surgical soap from the neck down, paying special attention to the area where your procedure will be performed.  Advised of plan to go home the same day and will only stay overnight if medically necessary. You MUST have a responsible adult to drive you home and MUST be with you the first 24 hours after you arrive home.  Patient verbalized understanding to all instructions provided and agreed to proceed with procedure.

## 2023-12-21 ENCOUNTER — Telehealth: Payer: Self-pay

## 2023-12-21 NOTE — Telephone Encounter (Signed)
 Pt aware of time change for her ICD Implant with Dr. Inocencio on 8/1 - She will arrive at [facility] at 12:30 pm for a 2:30 pm procedure.

## 2023-12-24 NOTE — Pre-Procedure Instructions (Signed)
Attempted to call patient  regarding procedure instructions.  Left voicemail on the following items: Arrival time 1200 Nothing to eat or drink after midnight No meds AM of procedure Responsible person to drive you home and stay with you for 24 hrs Wash with special soap night before and morning of procedure

## 2023-12-25 ENCOUNTER — Other Ambulatory Visit: Payer: Self-pay

## 2023-12-25 ENCOUNTER — Ambulatory Visit (HOSPITAL_COMMUNITY)
Admission: RE | Admit: 2023-12-25 | Discharge: 2023-12-25 | Disposition: A | Discharge status: Home / Self Care | Attending: Cardiology | Admitting: Cardiology

## 2023-12-25 ENCOUNTER — Encounter: Payer: Self-pay | Admitting: Emergency Medicine

## 2023-12-25 ENCOUNTER — Encounter (HOSPITAL_COMMUNITY): Payer: Self-pay | Admitting: Anesthesiology

## 2023-12-25 ENCOUNTER — Ambulatory Visit (HOSPITAL_COMMUNITY)

## 2023-12-25 ENCOUNTER — Encounter (HOSPITAL_COMMUNITY): Admission: RE | Disposition: A | Discharge status: Home / Self Care | Source: Home / Self Care | Attending: Cardiology

## 2023-12-25 DIAGNOSIS — I429 Cardiomyopathy, unspecified: Secondary | ICD-10-CM | POA: Diagnosis not present

## 2023-12-25 DIAGNOSIS — I5022 Chronic systolic (congestive) heart failure: Secondary | ICD-10-CM | POA: Insufficient documentation

## 2023-12-25 DIAGNOSIS — I428 Other cardiomyopathies: Secondary | ICD-10-CM | POA: Insufficient documentation

## 2023-12-25 DIAGNOSIS — I5020 Unspecified systolic (congestive) heart failure: Secondary | ICD-10-CM

## 2023-12-25 HISTORY — PX: ICD IMPLANT: EP1208

## 2023-12-25 LAB — PREGNANCY, URINE: Preg Test, Ur: NEGATIVE

## 2023-12-25 SURGERY — ICD IMPLANT

## 2023-12-25 MED ORDER — POVIDONE-IODINE 10 % EX SWAB: 2.0000 | Freq: Once | CUTANEOUS | Status: DC

## 2023-12-25 MED ORDER — ONDANSETRON HCL 4 MG/2ML IJ SOLN: 4.0000 mg | Freq: Four times a day (QID) | INTRAMUSCULAR | Status: DC | PRN

## 2023-12-25 MED ORDER — FENTANYL CITRATE (PF) 100 MCG/2ML IJ SOLN
INTRAMUSCULAR | Status: DC | PRN
  Administered 2023-12-25 (×4): 25 ug via INTRAVENOUS

## 2023-12-25 MED ORDER — SODIUM CHLORIDE 0.9 % IV SOLN: INTRAVENOUS | Status: DC

## 2023-12-25 MED ORDER — LIDOCAINE HCL (PF) 1 % IJ SOLN
INTRAMUSCULAR | Status: AC
  Filled 2023-12-25: qty 60

## 2023-12-25 MED ORDER — FENTANYL CITRATE (PF) 100 MCG/2ML IJ SOLN
INTRAMUSCULAR | Status: AC
Start: 2023-12-25 — End: 2023-12-25
  Filled 2023-12-25: qty 2

## 2023-12-25 MED ORDER — CHLORHEXIDINE GLUCONATE 4 % EX SOLN: 4.0000 | Freq: Once | CUTANEOUS | Status: DC

## 2023-12-25 MED ORDER — MIDAZOLAM HCL 2 MG/2ML IJ SOLN
INTRAMUSCULAR | Status: AC
  Filled 2023-12-25: qty 2

## 2023-12-25 MED ORDER — CEFAZOLIN SODIUM-DEXTROSE 2-4 GM/100ML-% IV SOLN
2.0000 g | INTRAVENOUS | Status: AC
  Administered 2023-12-25: 2 g via INTRAVENOUS

## 2023-12-25 MED ORDER — HEPARIN (PORCINE) IN NACL 1000-0.9 UT/500ML-% IV SOLN
INTRAVENOUS | Status: DC | PRN
  Administered 2023-12-25: 500 mL

## 2023-12-25 MED ORDER — SODIUM CHLORIDE 0.9 % IV SOLN
80.0000 mg | INTRAVENOUS | Status: AC
  Administered 2023-12-25: 80 mg

## 2023-12-25 MED ORDER — SODIUM CHLORIDE 0.9 % IV SOLN
INTRAVENOUS | Status: AC
  Filled 2023-12-25: qty 2

## 2023-12-25 MED ORDER — ACETAMINOPHEN 325 MG PO TABS
325.0000 mg | ORAL_TABLET | ORAL | Status: DC | PRN
  Administered 2023-12-25: 650 mg via ORAL
  Filled 2023-12-25: qty 2

## 2023-12-25 MED ORDER — CEFAZOLIN SODIUM-DEXTROSE 2-4 GM/100ML-% IV SOLN
INTRAVENOUS | Status: DC
Start: 2023-12-25 — End: 2023-12-26
  Filled 2023-12-25: qty 100

## 2023-12-25 MED ORDER — MIDAZOLAM HCL 5 MG/5ML IJ SOLN
INTRAMUSCULAR | Status: DC | PRN
Start: 2023-12-25 — End: 2023-12-26
  Administered 2023-12-25 (×4): 1 mg via INTRAVENOUS

## 2023-12-25 MED ORDER — LIDOCAINE HCL (PF) 1 % IJ SOLN
INTRAMUSCULAR | Status: AC
  Filled 2023-12-25: qty 30

## 2023-12-25 MED ORDER — LIDOCAINE HCL (PF) 1 % IJ SOLN
INTRAMUSCULAR | Status: DC | PRN
  Administered 2023-12-25: 30 mL
  Administered 2023-12-25: 60 mL

## 2023-12-25 SURGICAL SUPPLY — 6 items
CABLE SURGICAL S-101-97-12 (CABLE) ×1 IMPLANT
ICD COBALT XT VR DVPA2D4 (ICD Generator) IMPLANT
LEAD SPRINT QUAT SEC 6935M-62 (Lead) IMPLANT
PAD DEFIB RADIO PHYSIO CONN (PAD) ×1 IMPLANT
SHEATH 9FR PRELUDE SNAP 13 (SHEATH) IMPLANT
TRAY PACEMAKER INSERTION (PACKS) ×1 IMPLANT

## 2023-12-25 NOTE — Discharge Instructions (Signed)
 After Your ICD (Implantable Cardiac Defibrillator)   You have a [employer] ICD  ACTIVITY Do not lift your arm above shoulder height for 1 week after your procedure. After 7 days, you may progress as below.  You should remove your sling 24 hours after your procedure, unless otherwise instructed by your provider.     Friday January 01, 2024  Saturday January 02, 2024 Sunday January 03, 2024 Monday January 04, 2024   Do not lift, push, pull, or carry anything over 10 pounds with the affected arm until 6 weeks (Friday February 05, 2024 ) after your procedure.   You may drive AFTER your wound check, unless you have been told otherwise by your provider.   Ask your healthcare provider when you can go back to work   INCISION/Dressing If you are on a blood thinner such as Coumadin, Xarelto, Eliquis, Plavix , or Pradaxa please confirm with your provider when this should be resumed.   If large square, outer bandage is left in place, this can be removed after 24 hours from your procedure. Do not remove steri-strips or glue as below.   Monitor your defibrillator site for redness, swelling, and drainage. Call the device clinic at [phone] if you experience these symptoms or fever/chills.  If your incision is sealed with Steri-strips or staples, you may shower 7 days after your procedure or when told by your provider. Do not remove the steri-strips or let the shower hit directly on your site. You may wash around your site with soap and water.    If you were discharged in a sling, please do not wear this during the day more than 48 hours after your surgery unless otherwise instructed. This may increase the risk of stiffness and soreness in your shoulder.   Avoid lotions, ointments, or perfumes over your incision until it is well-healed.  You may use a hot tub or a pool AFTER your wound check appointment if the incision is completely closed.  Your ICD is designed to protect you from life threatening  heart rhythms. Because of this, you may receive a shock.   1 shock with no symptoms:  Call the office during business hours. 1 shock with symptoms (chest pain, chest pressure, dizziness, lightheadedness, shortness of breath, overall feeling unwell):  Call 911. If you experience 2 or more shocks in 24 hours:  Call 911. If you receive a shock, you should not drive for 6 months per the [place] DMV IF you receive appropriate therapy from your ICD.   ICD Alerts:  Some alerts are vibratory and others beep. These are NOT emergencies. Please call our office to let us  know. If this occurs at night or on weekends, it can wait until the next business day. Send a remote transmission.  If your device is capable of reading fluid status (for heart failure), you will be offered monthly monitoring to review this with you.   DEVICE MANAGEMENT Remote monitoring is used to monitor your ICD from home. This monitoring is scheduled every 91 days by our office. It allows us  to keep an eye on the functioning of your device to ensure it is working properly. You will routinely see your Electrophysiologist annually (more often if necessary).   You should receive your ID card for your new device in 4-8 weeks. Keep this card with you at all times once received. Consider wearing a medical alert bracelet or necklace.  Your ICD  may be MRI compatible. This will be discussed at your next  office visit/wound check.  You should avoid contact with strong electric or magnetic fields.   Do not use amateur (ham) radio equipment or electric (arc) welding torches. MP3 player headphones with magnets should not be used. Some devices are safe to use if held at least 12 inches (30 cm) from your defibrillator. These include power tools, lawn mowers, and speakers. If you are unsure if something is safe to use, ask your health care provider.  When using your cell phone, hold it to the ear that is on the opposite side from the defibrillator. Do not  leave your cell phone in a pocket over the defibrillator.  You may safely use electric blankets, heating pads, computers, and microwave ovens.  Call the office right away if: You have chest pain. You feel more than one shock. You feel more short of breath than you have felt before. You feel more light-headed than you have felt before. Your incision starts to open up.  This information is not intended to replace advice given to you by your health care provider. Make sure you discuss any questions you have with your health care provider.

## 2023-12-25 NOTE — Progress Notes (Signed)
 Patient noticed to have a hematoma at left chest site At 1830, pressure held. No decrease in swelling. Patient in severe pain. MD Camnitiz paged. Pressure released and held again at 1915. Camnitz at bedside at 2015 to place pressure dressing. Patient discharged home

## 2023-12-25 NOTE — Progress Notes (Signed)
 Report to [place], RN

## 2023-12-25 NOTE — Interval H&P Note (Signed)
 History and Physical Interval Note:  12/25/2023 2:04 PM  Robyn Hanna  has presented today for surgery, with the diagnosis of heart failure.  The various methods of treatment have been discussed with the patient and family. After consideration of risks, benefits and other options for treatment, the patient has consented to  Procedure(s): ICD IMPLANT (N/A) as a surgical intervention.  The patient's history has been reviewed, patient examined, no change in status, stable for surgery.  I have reviewed the patient's chart and labs.  Questions were answered to the patient's satisfaction.     Woods Gangemi [employer]

## 2023-12-26 ENCOUNTER — Encounter (HOSPITAL_COMMUNITY): Payer: Self-pay | Admitting: Cardiology

## 2023-12-27 ENCOUNTER — Telehealth: Payer: Self-pay | Admitting: Student

## 2023-12-27 NOTE — Telephone Encounter (Addendum)
   The patient called the after hours line reporting pain along her ICD site. Had developed a hematoma at the site and pressure was applied following her procedure. She still has a pressure dressing in place. She was wanting to see if she could alternate between taking Tylenol  and Motrin  and we reviewed that NSAIDS should be avoided in the setting of her heart failure. Reviewed she could take Tylenol  500mg  Q4H but not to exceed 3000mg  within 24 hours. She mentioned Dr. Inocencio was going to arrange for a wound check this coming week but I do not see anything until 01/07/2024. Will send this note to Dr. Inocencio and the Device Clinic to verify.   Signed, Laymon CHRISTELLA Qua, PA-C 12/27/2023, 3:19 PM

## 2023-12-28 NOTE — Telephone Encounter (Signed)
 A message from another encounter was sent to EP scheduling to make wound check apt.

## 2023-12-29 ENCOUNTER — Ambulatory Visit: Attending: Cardiovascular Disease | Admitting: *Deleted

## 2023-12-29 ENCOUNTER — Telehealth: Payer: Self-pay

## 2023-12-29 ENCOUNTER — Encounter (HOSPITAL_COMMUNITY): Payer: Self-pay

## 2023-12-29 DIAGNOSIS — Z950 Presence of cardiac pacemaker: Secondary | ICD-10-CM

## 2023-12-29 NOTE — Telephone Encounter (Signed)
 Follow-up after same day discharge: Implant date: 12/25/2023 MD: Soyla Norton, MD Device: ICD Location: L chest    Wound check visit: yes 90 day MD follow-up: yes  Remote Transmission received:yes  Dressing/sling removed: yes  Confirm OAC restart on: n/a  Please continue to monitor your cardiac device site for redness, swelling, and drainage. Call the device clinic at [phone] if you experience these symptoms, fever/chills, or have questions about your device.   Remote monitoring is used to monitor your cardiac device from home. This monitoring is scheduled every 91 days by our office. It allows us  to keep an eye on the functioning of your device to ensure it is working properly.

## 2023-12-29 NOTE — Progress Notes (Signed)
 Patient seen in clinic today to remove pressure dressing, and reassess hematoma around device incision site. Pressure dressing removed. Steri-strips remain intact. Bruising noted on left side of device extending to patient's armpit that was yellow/blackish/bluish in color indicating healing. Camnitz in clinic today and requested to look at site. Camnitz stated that the site looked good and there was no need for another pressure dressing. Patient instructed to continue watching site for increased swelling or new dark bruising around site and to call clinic immediately if seen. Patient verbalized understanding of instructions provided by this RN. Arm restrictions reviewed with patient and she verbalized understanding. All questions and concerns addressed at this time. Device clinic number provided if she has further concerns.

## 2023-12-30 MED FILL — Midazolam HCl Inj 2 MG/2ML (Base Equivalent): INTRAMUSCULAR | Qty: 4 | Status: AC

## 2024-01-04 ENCOUNTER — Ambulatory Visit: Attending: Internal Medicine

## 2024-01-04 ENCOUNTER — Telehealth: Payer: Self-pay

## 2024-01-04 DIAGNOSIS — Z95810 Presence of automatic (implantable) cardiac defibrillator: Secondary | ICD-10-CM

## 2024-01-04 NOTE — Telephone Encounter (Signed)
 Patient calls in to report that swelling and bruising to device site is increasing in size since last week's in office check.  The pain is also increasing.    She went back to work today but cannot tolerate the pain and is concerned.   She will come to clinic today at noon for re-eval of wound.

## 2024-01-04 NOTE — Progress Notes (Signed)
 Patient brought in to re-assess her device site after reporting increased swelling and pain since visit last week.   There is noticeable increase in swelling to the area around the top of the device; however, there is no new bruising or redness/warmth to the site.  Noted signs of healing present with yellowish bruising and small stitch X 2  exposed to right and left of incision line.  Steri strips intact with no signs of active drainage or oozing.  Patient is taking Tylenol  for the pain.   Dr. Kennyth present and evaluated area.  He orders to clip visible stitches and re-apply pressure dressing.  No signs of infection or emergent swelling.  Will have patient return for post op wound/device check on Thursday and can follow up with Dr. Inocencio in office next Tuesday as needed.  Dr. Inocencio notified and agrees with plan today.   Patient taken to exam room and pressure dressing re-applied after removing both stitches, she tolerated procedure well.  She is aware to withhold showers or getting the area wet until re-eval and to call this office if any concerning signs develop.  Patient verbalizes understanding.

## 2024-01-07 ENCOUNTER — Other Ambulatory Visit: Payer: Self-pay | Admitting: Internal Medicine

## 2024-01-07 ENCOUNTER — Ambulatory Visit: Attending: Internal Medicine

## 2024-01-07 DIAGNOSIS — I429 Cardiomyopathy, unspecified: Secondary | ICD-10-CM

## 2024-01-07 LAB — CUP PACEART INCLINIC DEVICE CHECK
Date Time Interrogation Session: 20250814085558
Implantable Lead Connection Status: 753985
Implantable Lead Implant Date: 20250801
Implantable Lead Location: 753860
Implantable Pulse Generator Implant Date: 20250801

## 2024-01-07 NOTE — Patient Instructions (Signed)

## 2024-01-07 NOTE — Progress Notes (Signed)
 Normal single chamber ICD wound check. Wound well healed. Slight swelling noted around incision site. Previous hematoma noted post procedure. Wound recheck scheduled 8/19. Presenting rhythm: VS 83. Routine testing performed. Thresholds, sensing, and impedances consistent with implant measurements with 3.5V safety margin/auto capture until 3 month visit. No treated arrhythmias. Reviewed arm restrictions to continue for 6 weeks total post op. Reviewed shock plan.  Pt enrolled in remote follow-up.

## 2024-01-09 ENCOUNTER — Ambulatory Visit: Payer: Self-pay | Admitting: Cardiology

## 2024-01-12 ENCOUNTER — Ambulatory Visit: Attending: Cardiology | Admitting: *Deleted

## 2024-01-12 ENCOUNTER — Encounter: Payer: Self-pay | Admitting: *Deleted

## 2024-01-12 DIAGNOSIS — Z95810 Presence of automatic (implantable) cardiac defibrillator: Secondary | ICD-10-CM

## 2024-01-12 NOTE — Progress Notes (Signed)
 Patient seen in clinic for follow up check on hematoma at incision site.  Wound is healing well. 1 small scab area noted and removed with sterile alcohol wipe to allow for continued healing. Patient stated that the swelling is initially increased in the mornings, but that once she gets out of bed and gets moving that the swelling decreases  Continued healing of bruising noted on left chest/axillary region with no new bruising noted Continued lifting restrictions and s/s of bleeding/infection reviewed again with patient and she verbalized understanding Patient given Dr. note for return to work light duty with continued arm restrictions (until 02/05/24) listed for employer to review if needed All questions and concerns addressed at this time Patient will follow up at 90 day visit No charge for visit

## 2024-02-09 ENCOUNTER — Ambulatory Visit (INDEPENDENT_AMBULATORY_CARE_PROVIDER_SITE_OTHER)

## 2024-02-09 DIAGNOSIS — I429 Cardiomyopathy, unspecified: Secondary | ICD-10-CM

## 2024-02-10 LAB — CUP PACEART REMOTE DEVICE CHECK
Battery Remaining Longevity: 170 mo
Battery Voltage: 3.18 V
Brady Statistic RA Percent Paced: INVALID
Brady Statistic RV Percent Paced: 0 %
Date Time Interrogation Session: 20250916064952
HighPow Impedance: 70 Ohm
Implantable Lead Connection Status: 753985
Implantable Lead Implant Date: 20250801
Implantable Lead Location: 753860
Implantable Pulse Generator Implant Date: 20250801
Lead Channel Impedance Value: 342 Ohm
Lead Channel Impedance Value: 418 Ohm
Lead Channel Pacing Threshold Amplitude: 0.5 V
Lead Channel Pacing Threshold Pulse Width: 0.4 ms
Lead Channel Sensing Intrinsic Amplitude: 10.9 mV
Lead Channel Setting Pacing Amplitude: 3.5 V
Lead Channel Setting Pacing Pulse Width: 0.4 ms
Lead Channel Setting Sensing Sensitivity: 0.45 mV

## 2024-02-15 ENCOUNTER — Ambulatory Visit: Payer: Self-pay | Admitting: Cardiology

## 2024-02-15 ENCOUNTER — Other Ambulatory Visit (HOSPITAL_COMMUNITY): Payer: Self-pay | Admitting: Cardiology

## 2024-02-15 NOTE — Progress Notes (Signed)
Remote ICD Transmission.

## 2024-04-04 ENCOUNTER — Encounter: Payer: Self-pay | Admitting: Cardiology

## 2024-04-04 ENCOUNTER — Ambulatory Visit: Attending: Cardiology | Admitting: Cardiology

## 2024-04-04 VITALS — BP 120/70 | HR 88 | Ht 62.0 in | Wt 237.0 lb

## 2024-04-04 DIAGNOSIS — I5022 Chronic systolic (congestive) heart failure: Secondary | ICD-10-CM | POA: Insufficient documentation

## 2024-04-04 DIAGNOSIS — I429 Cardiomyopathy, unspecified: Secondary | ICD-10-CM | POA: Insufficient documentation

## 2024-04-04 LAB — CUP PACEART INCLINIC DEVICE CHECK
Date Time Interrogation Session: 20251110171625
HighPow Impedance: 77 Ohm
Implantable Lead Connection Status: 753985
Implantable Lead Implant Date: 20250801
Implantable Lead Location: 753860
Implantable Pulse Generator Implant Date: 20250801
Lead Channel Impedance Value: 475 Ohm
Lead Channel Pacing Threshold Amplitude: 0.5 V
Lead Channel Pacing Threshold Pulse Width: 0.4 ms
Lead Channel Sensing Intrinsic Amplitude: 10.9 mV

## 2024-04-04 NOTE — Progress Notes (Signed)
  Electrophysiology Office Note:   Date:  04/04/2024  ID:  Eiko Mcgowen, DOB 07-26-1985, MRN [id number]  Primary Cardiologist: Vishnu P Mallipeddi, MD Primary Heart Failure: Ezra Shuck, MD Electrophysiologist: Soyla Gladis Norton, MD      History of Present Illness:   Devika Dragovich is a 38 y.o. female with h/o chronic systolic heart failure due to nonischemic cardiomyopathy, hypertension, obesity seen today for routine electrophysiology follow-up s/p Defibrillator implant.  Since last being seen in our clinic the patient reports doing well.  She has no chest pain or shortness of breath.  She able to do her daily activities without restriction.  she denies chest pain, palpitations, dyspnea, PND, orthopnea, nausea, vomiting, dizziness, syncope, edema, weight gain, or early satiety.    Review of systems complete and found to be negative unless listed in HPI.      EP Information / Studies Reviewed:    EKG is ordered today. Personal review as below.  EKG Interpretation Date/Time:  Monday April 04 2024 16:01:58 EST Ventricular Rate:  88 PR Interval:  130 QRS Duration:  106 QT Interval:  364 QTC Calculation: 440 R Axis:   94  Text Interpretation: Normal sinus rhythm Rightward axis When compared with ECG of 25-Dec-2023 17:53, Questionable change in QRS axis Confirmed by Vickki Igou ([id number]) on 04/04/2024 4:20:58 PM   ICD Interrogation-  reviewed in detail today,  See PACEART report.  Device History: Medtronic Single Chamber ICD implanted 12/25/2023 for chronic systolic heart failure History of appropriate therapy: No History of AAD therapy: No   Risk Assessment/Calculations:            Physical Exam:   VS:  BP 120/70 (BP Location: Right Arm, Patient Position: Sitting, Cuff Size: Large)   Pulse 88   Ht 5' 2 (1.575 m)   Wt 237 lb (107.5 kg)   SpO2 97%   BMI 43.35 kg/m    Wt Readings from Last 3 Encounters:  04/04/24 237 lb (107.5 kg)  12/25/23 230 lb (104.3 kg)   12/04/23 250 lb (113.4 kg)     GEN: Well nourished, well developed in no acute distress NECK: No JVD; No carotid bruits CARDIAC: Regular rate and rhythm, no murmurs, rubs, gallops RESPIRATORY:  Clear to auscultation without rales, wheezing or rhonchi  ABDOMEN: Soft, non-tender, non-distended EXTREMITIES:  No edema; No deformity   ASSESSMENT AND PLAN:    Chronic systolic dysfunction s/p Medtronic single chamber ICD  euvolemic today Stable on an appropriate medical regimen Normal ICD function See Pace Art report No changes today  Disposition:   Follow up with EP Team in 12 months   Signed, [place] Gladis Norton, MD

## 2024-04-04 NOTE — Patient Instructions (Signed)
 Medication Instructions:  Your physician recommends that you continue on your current medications as directed. Please refer to the Current Medication list given to you today.  *If you need a refill on your cardiac medications before your next appointment, please call your pharmacy*  Follow-Up: At [facility], you and your health needs are our priority.  As part of our continuing mission to provide you with exceptional heart care, our providers are all part of one team.  This team includes your primary Cardiologist (physician) and Advanced Practice Providers or APPs (Physician Assistants and Nurse Practitioners) who all work together to provide you with the care you need, when you need it.  Your next appointment:   1 year(s)  Provider:   You may see one of the following Advanced Practice Providers on your designated Care Team:   Charlies Arthur, [place] Ozell Jodie Passey, PA-C Suzann Riddle, NP Daphne Barrack, NP Artist Pouch, PA-C

## 2024-04-05 ENCOUNTER — Other Ambulatory Visit: Payer: Self-pay | Admitting: Nurse Practitioner

## 2024-04-05 ENCOUNTER — Ambulatory Visit: Payer: Self-pay | Admitting: Cardiology

## 2024-04-05 DIAGNOSIS — Z1231 Encounter for screening mammogram for malignant neoplasm of breast: Secondary | ICD-10-CM

## 2024-05-10 ENCOUNTER — Encounter

## 2024-05-10 DIAGNOSIS — I5022 Chronic systolic (congestive) heart failure: Secondary | ICD-10-CM | POA: Diagnosis not present

## 2024-05-12 ENCOUNTER — Ambulatory Visit: Payer: Self-pay | Admitting: Cardiology

## 2024-05-12 ENCOUNTER — Telehealth: Payer: Self-pay | Admitting: Cardiology

## 2024-05-12 ENCOUNTER — Telehealth: Payer: Self-pay

## 2024-05-12 LAB — CUP PACEART REMOTE DEVICE CHECK
Battery Remaining Longevity: 166 mo
Battery Voltage: 3.11 V
Brady Statistic RA Percent Paced: INVALID
Brady Statistic RV Percent Paced: 0 %
Date Time Interrogation Session: 20251216044919
HighPow Impedance: 75 Ohm
Implantable Lead Connection Status: 753985
Implantable Lead Implant Date: 20250801
Implantable Lead Location: 753860
Implantable Pulse Generator Implant Date: 20250801
Lead Channel Impedance Value: 323 Ohm
Lead Channel Impedance Value: 418 Ohm
Lead Channel Pacing Threshold Amplitude: 0.375 V
Lead Channel Pacing Threshold Pulse Width: 0.4 ms
Lead Channel Sensing Intrinsic Amplitude: 10.8 mV
Lead Channel Setting Pacing Amplitude: 2 V
Lead Channel Setting Pacing Pulse Width: 0.4 ms
Lead Channel Setting Sensing Sensitivity: 0.45 mV

## 2024-05-12 NOTE — Telephone Encounter (Signed)
 See separate phone encounter addressing.

## 2024-05-12 NOTE — Telephone Encounter (Signed)
 Pt called in asking if he have her teeth with a ultrasonic dental scaler since she had ICD implanted this year. Please advise. Per Danette, CMA, she will call pt back.

## 2024-05-12 NOTE — Telephone Encounter (Signed)
 Spoke with pt and she is at dentist and they are wanting to use a ultrasonic dental scaler but pt has a ICD. Per Damien she needs to have a magnet put over device in order for them to use it. Dentist will send over clearance form

## 2024-05-12 NOTE — Telephone Encounter (Signed)
 Noted, thanks we will watch for the clearance.

## 2024-05-13 NOTE — Progress Notes (Signed)
 Remote ICD Transmission

## 2024-06-03 ENCOUNTER — Ambulatory Visit
Admission: RE | Admit: 2024-06-03 | Discharge: 2024-06-03 | Disposition: A | Source: Ambulatory Visit | Attending: Nurse Practitioner | Admitting: Nurse Practitioner

## 2024-06-03 DIAGNOSIS — Z1231 Encounter for screening mammogram for malignant neoplasm of breast: Secondary | ICD-10-CM

## 2024-06-09 ENCOUNTER — Other Ambulatory Visit: Payer: Self-pay | Admitting: Nurse Practitioner

## 2024-06-09 DIAGNOSIS — R928 Other abnormal and inconclusive findings on diagnostic imaging of breast: Secondary | ICD-10-CM

## 2024-06-14 ENCOUNTER — Encounter (HOSPITAL_COMMUNITY): Payer: Self-pay | Admitting: Cardiology

## 2024-06-14 ENCOUNTER — Ambulatory Visit (HOSPITAL_COMMUNITY): Payer: Self-pay | Admitting: Cardiology

## 2024-06-14 ENCOUNTER — Ambulatory Visit (HOSPITAL_COMMUNITY)
Admission: RE | Admit: 2024-06-14 | Discharge: 2024-06-14 | Disposition: A | Discharge status: Home / Self Care | Source: Ambulatory Visit | Attending: Cardiology | Admitting: Cardiology

## 2024-06-14 VITALS — BP 110/70 | HR 63 | Wt 242.0 lb

## 2024-06-14 DIAGNOSIS — Z95810 Presence of automatic (implantable) cardiac defibrillator: Secondary | ICD-10-CM | POA: Diagnosis not present

## 2024-06-14 DIAGNOSIS — R0683 Snoring: Secondary | ICD-10-CM | POA: Diagnosis not present

## 2024-06-14 DIAGNOSIS — R9431 Abnormal electrocardiogram [ECG] [EKG]: Secondary | ICD-10-CM | POA: Diagnosis not present

## 2024-06-14 DIAGNOSIS — Z7984 Long term (current) use of oral hypoglycemic drugs: Secondary | ICD-10-CM | POA: Diagnosis not present

## 2024-06-14 DIAGNOSIS — I429 Cardiomyopathy, unspecified: Secondary | ICD-10-CM | POA: Diagnosis not present

## 2024-06-14 DIAGNOSIS — Z6841 Body Mass Index (BMI) 40.0 and over, adult: Secondary | ICD-10-CM | POA: Diagnosis not present

## 2024-06-14 DIAGNOSIS — I110 Hypertensive heart disease with heart failure: Secondary | ICD-10-CM | POA: Insufficient documentation

## 2024-06-14 DIAGNOSIS — E669 Obesity, unspecified: Secondary | ICD-10-CM | POA: Insufficient documentation

## 2024-06-14 DIAGNOSIS — R7989 Other specified abnormal findings of blood chemistry: Secondary | ICD-10-CM | POA: Insufficient documentation

## 2024-06-14 DIAGNOSIS — I5020 Unspecified systolic (congestive) heart failure: Secondary | ICD-10-CM | POA: Diagnosis not present

## 2024-06-14 DIAGNOSIS — Z79899 Other long term (current) drug therapy: Secondary | ICD-10-CM | POA: Diagnosis not present

## 2024-06-14 DIAGNOSIS — I5022 Chronic systolic (congestive) heart failure: Secondary | ICD-10-CM | POA: Diagnosis present

## 2024-06-14 DIAGNOSIS — Q8719 Other congenital malformation syndromes predominantly associated with short stature: Secondary | ICD-10-CM | POA: Diagnosis not present

## 2024-06-14 LAB — BASIC METABOLIC PANEL WITH GFR
Anion gap: 10 (ref 5–15)
BUN: 9 mg/dL (ref 6–20)
CO2: 29 mmol/L (ref 22–32)
Calcium: 9.8 mg/dL (ref 8.9–10.3)
Chloride: 100 mmol/L (ref 98–111)
Creatinine, Ser: 0.67 mg/dL (ref 0.44–1.00)
GFR, Estimated: >60 mL/min
Glucose, Bld: 96 mg/dL (ref 70–99)
Potassium: 4.3 mmol/L (ref 3.5–5.1)
Sodium: 138 mmol/L (ref 135–145)

## 2024-06-14 LAB — PRO BRAIN NATRIURETIC PEPTIDE: Pro Brain Natriuretic Peptide: 101 pg/mL

## 2024-06-14 NOTE — Progress Notes (Signed)
 PCP: Melba Lamarr BRAVO, NP Cardiology: Dr. Stacia HF Cardiology: Dr. Rolan  Chief complaint: CHF  39 y.o. with history of HTN and chronic systolic CHF was referred to heart failure clinic by Dr. Mallipeddi for evaluation of cardiomyopathy of uncertain etiology.  She developed dyspnea and was hospitalized in [place] in 4/22.  Echo at that time showed EF of 35%.  She was seen in the ER at [facility] in 12/23 with chest pain, dyspnea, and elevated BNP. CTA chest showed no PE.  She was subsequently referred to Dr. Stacia in [place]. She was started on Lasix  and GDMT. Repeat echo in 7/24 showed EF 25-30%, global hypokinesis, severely dilated LV, mildly decreased RV systolic function, mild MR.   LHC/RHC in 9/24 showed no significant CAD and normal filling pressures/preserved cardiac output.   Patient does not drink or smoke, occasionally uses marijuana but no other drugs.  She does not seem to have a strong family history of CAD or cardiomyopathy.  The diagnosis of CHF does not seem to be related to a pregnancy (has one child).    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  Some mutations in this gene are linked to Noonan's syndrome, but her mutations is not one of the known pathogenic mutations.  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    Cardiac MRI in 12/24 showed EF 30%, mild LVH, RV EF 35%, no LGE. Medtronic ICD implanted in 8/25.   Today she returns for HF follow up. Weight is down 1 lb.  She continues to work full time. No exertional dyspnea with usual activities.  No chest pain. No lightheadedness.  No orthopnea/PND.  She snores and has daytime sleepiness.   Labs (8/24): BNP 62, K 3.5, creatinine 0.83, hgb 13.4 Labs (10/24): K 4.4, creatinine 0.54 Labs (12/24): BNP 29, K 4.9, creatinine 0.68  ECG (personally reviewed): NSR  MDT device interrogation: No VT, stable thoracic impedance.   PMH: 1. HTN 2. Chronic systolic  CHF: Medtronic ICD.  Echo in [place] in 4/22 with EF 35%.   - Echo (7/24): EF 25-30%, global hypokinesis, severely dilated LV, mildly decreased RV systolic function, mild MR. - LHC/RHC (9/24): no significant CAD; mean RA 3, PA 27/7, mean PCWP 3, CI 2.61.  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  Some RIT1 mutations are associated with Noonan's syndrome.  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  - Cardiac MRI (12/24): EF 30%, mild LVH, RV EF 35%, no LGE.  SH: Lives in [place], has 1 child.  Works 2 jobs.  No ETOH, uses occasional marijuana but no other drugs.  No tobacco.   FH: No premature CAD, cardiomyopathy, or sudden death that she knows of.   ROS: All systems reviewed and negative except as per HPI.   Current Outpatient Medications  Medication Sig Dispense Refill   albuterol (VENTOLIN HFA) 108 (90 Base) MCG/ACT inhaler Inhale 1-2 puffs into the lungs every 6 (six) hours as needed for wheezing or shortness of breath.     carvedilol  (COREG ) 25 MG tablet Take 1 tablet (25 mg total) by mouth 2 (two) times daily. 180 tablet 3   diazepam (VALIUM) 10 MG tablet Take 10 mg by mouth daily.     FARXIGA  10 MG TABS tablet Take 1 tablet (10 mg total) by mouth daily before breakfast. PLEASE SCHEDULE APPOINTMENT FOR MORE REFILLS 30 tablet 0   furosemide  (LASIX ) 40 MG tablet Take 1  tablet by mouth twice daily 180 tablet 2   LARIN 24 FE 1-20 MG-MCG(24) tablet Take 1 tablet by mouth daily.     montelukast (SINGULAIR) 10 MG tablet Take 10 mg by mouth at bedtime.     Multiple Vitamins-Minerals (ADULT ONE DAILY GUMMIES PO) Take 1 capsule by mouth daily.     phentermine (ADIPEX-P) 37.5 MG tablet Take 37.5 mg by mouth every morning.     sacubitril -valsartan  (ENTRESTO ) 97-103 MG Take 1 tablet by mouth twice daily 180 tablet 3   spironolactone  (ALDACTONE ) 25 MG tablet Take 1 tablet by mouth once daily 90 tablet 3   Vitamin D, Ergocalciferol, (DRISDOL) 1.25 MG  (50000 UNIT) CAPS capsule Take 50,000 Units by mouth every Monday.     No current facility-administered medications for this encounter.   Wt Readings from Last 3 Encounters:  06/14/24 109.8 kg (242 lb)  04/04/24 107.5 kg (237 lb)  12/25/23 104.3 kg (230 lb)   BP 110/70   Pulse 63   Wt 109.8 kg (242 lb)   SpO2 98%   BMI 44.26 kg/m  General: NAD, obese.  Neck: No JVD, no thyromegaly or thyroid nodule.  Lungs: Clear to auscultation bilaterally with normal respiratory effort. CV: Nondisplaced PMI.  Heart regular S1/S2, no S3/S4, no murmur.  No peripheral edema.  No carotid bruit.  Normal pedal pulses.  Abdomen: Soft, nontender, no hepatosplenomegaly, no distention.  Skin: Intact without lesions or rashes.  Neurologic: Alert and oriented x 3.  Psych: Normal affect. Extremities: No clubbing or cyanosis.  HEENT: Normal.   Assessment/Plan: 1. Chronic systolic CHF: Medtronic ICD.  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  Most recent echo in 7/24 with EF 25-30%, global hypokinesis, severely dilated LV, mildly decreased RV systolic function, mild MR.  She does not have a significant substance abuse history and family history does not seem suggestive of a genetic cardiomyopathy.  Diagnosis of cardiomyopathy was not around the time of pregnancy. RHC/LHC in 9/24 showed no significant CAD, low filling pressures and preserved cardiac output.  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  Some mutations in this gene are linked to Noonan's syndrome, but her mutations is not one of the known pathogenic mutations.  She saw Danford Pac who thought this was a benign mutation.  Cardiac MRI in 12/24 showed  EF 30%, mild LVH, RV EF 35%, no LGE.  She is not volume overloaded on exam or by Optivol.  NYHA class I.   - Continue Entresto  97/103 bid.  - Continue Coreg  25 mg bid.  - Continue Farxiga  10 mg daily.  - Continue spironolactone  25 mg daily. BMET/BNP today.   - Continue Lasix  40 mg bid.  - I will arrange for repeat echo.   2. HTN: BP controlled.  - Continue current meds. 3. OSA: Strongly suspect.  Snores, daytime sleepiness.  - She still needs home sleep study, will order.  4. Obesity: Body mass index is 44.26 kg/m. - Refer to pharmacy clinic for GLP-1 agonist.   Follow up in 4 months with APP.   I spent 32 minutes reviewing records, interviewing/examining patient, and managing orders.   Ezra Shuck  06/14/2024

## 2024-06-14 NOTE — Patient Instructions (Signed)
 There has been no changes to your medications.  Labs done today, your results will be available in MyChart, we will contact you for abnormal readings.  Your physician has requested that you have an echocardiogram. Echocardiography is a painless test that uses sound waves to create images of your heart. It provides your doctor with information about the size and shape of your heart and how well your hearts chambers and valves are working. This procedure takes approximately one hour. There are no restrictions for this procedure. Please do NOT wear cologne, perfume, aftershave, or lotions (deodorant is allowed). Please arrive 15 minutes prior to your appointment time.  Please note: We ask at that you not bring children with you during ultrasound (echo/ vascular) testing. Due to room size and safety concerns, children are not allowed in the ultrasound rooms during exams. Our front office staff cannot provide observation of children in our lobby area while testing is being conducted. An adult accompanying a patient to their appointment will only be allowed in the ultrasound room at the discretion of the ultrasound technician under special circumstances. We apologize for any inconvenience.  You will be called to have your sleep study test arranged.  You have been referred to the HEART CARE PHARMACY TEAM. They will call you to arrange your appointment.  Your physician recommends that you schedule a follow-up appointment in: 4 months.  If you have any questions or concerns before your next appointment please send us  a message through [place] or call our office at [phone].    TO LEAVE A MESSAGE FOR THE NURSE SELECT OPTION 2, PLEASE LEAVE A MESSAGE INCLUDING: YOUR NAME DATE OF BIRTH CALL BACK NUMBER REASON FOR CALL**this is important as we prioritize the call backs  YOU WILL RECEIVE A CALL BACK THE SAME DAY AS LONG AS YOU CALL BEFORE 4:00 PM  At the [facility], you and your  health needs are our priority. As part of our continuing mission to provide you with exceptional heart care, we have created designated Provider Care Teams. These Care Teams include your primary Cardiologist (physician) and Advanced Practice Providers (APPs- Physician Assistants and Nurse Practitioners) who all work together to provide you with the care you need, when you need it.   You may see any of the following providers on your designated Care Team at your next follow up: Dr Toribio Fuel Dr Ezra Shuck Dr. Morene Brownie Greig Mosses, NP Caffie Shed, [place] [facility] [place], [place] Beckey Coe, NP Jordan Lee, NP Ellouise Class, NP Tinnie Redman, PharmD Jaun Bash, PharmD   Please be sure to bring in all your medications bottles to every appointment.    Thank you for choosing [facility]

## 2024-06-22 ENCOUNTER — Other Ambulatory Visit

## 2024-06-22 ENCOUNTER — Encounter

## 2024-06-30 ENCOUNTER — Ambulatory Visit (HOSPITAL_COMMUNITY)
Admission: RE | Admit: 2024-06-30 | Discharge: 2024-06-30 | Disposition: A | Source: Ambulatory Visit | Attending: Cardiology | Admitting: Cardiology

## 2024-06-30 ENCOUNTER — Other Ambulatory Visit (HOSPITAL_COMMUNITY): Payer: Self-pay | Admitting: Cardiology

## 2024-06-30 DIAGNOSIS — I5020 Unspecified systolic (congestive) heart failure: Secondary | ICD-10-CM

## 2024-06-30 DIAGNOSIS — Z006 Encounter for examination for normal comparison and control in clinical research program: Secondary | ICD-10-CM

## 2024-06-30 LAB — ECHOCARDIOGRAM COMPLETE
AR max vel: 2.89 cm2
AV Area VTI: 3.21 cm2
AV Area mean vel: 2.96 cm2
AV Mean grad: 4 mmHg
AV Peak grad: 7.5 mmHg
Ao pk vel: 1.37 m/s
Area-P 1/2: 4.71 cm2
Calc EF: 54.9 %
S' Lateral: 4.2 cm
Single Plane A2C EF: 54.9 %
Single Plane A4C EF: 56.9 %

## 2024-06-30 NOTE — Research (Cosign Needed)
 SITE: [id number]     Subject # [id number]   Subprotocol: A  Inclusion Criteria  Patients who meet all of the following criteria are eligible for enrollment as study participants:  Yes No  Age > 37 years old    X   Eligible to wear Holter Study    X    Exclusion Criteria  Patients who meet any of these criteria are not eligible for enrollment as study participants: Yes No  1. Receiving any mechanical (respiratory or circulatory) or renal support therapy at Screening or during Visit #1.    X  2.  Any other conditions that in the opinion of the investigators are likely to prevent compliance with the study protocol or pose a safety concern if the subject participates in the study.    X  3. Poor tolerance, namely susceptible to severe skin allergies from ECG adhesive patch application.    X   Protocol: REV H    60 minute start window         Cor device must be applied, and the study initiated, no later than 60 minutes of completing the Echocardiogram                             HH:MM  Echo completion time   15:20  2.   Cor Study start time   15:36   30-Minute execution window  Once Cor Monitoring begins, 3 QT Med ECGs and the 15-minute rest period must be completed within a 30 minute window     HH:MM  3. QT Med ECG Completion time   15:46  4. Start of 15-Min sitting rest period   15:46  5. End of 15-Min rest period   16:01  6. Time of device removal   16:01   *Continue to use the Mobile App Event feature to log the Rest period windows and follow instructions on the EF-ACT Clinical Trial  Patient Instruction Card.  Describe any anomalies in Protocol execution in the Protocol Deviation Log    Residential Zip code [phone] (First 3 digits ONLY)                                           [facility] Informed Consent   Subject Name: Robyn Hanna  Subject met inclusion and exclusion criteria.  The informed consent form, study requirements and expectations were reviewed with the subject. Subject had  opportunity to read consent and questions and concerns were addressed prior to the signing of the consent form.  The subject verbalized understanding of the trial requirements.  The subject agreed to participate in the [facility] EF ACT trial and signed the informed consent at 15:31 on 30-Jun-2024.  The informed consent was obtained prior to performance of any protocol-specific procedures for the subject.  A copy of the signed informed consent was given to the subject and a copy was placed in the subject's medical record.   Robyn Hanna         Current Medications[1]                                                                                                                        [  1]  Current Outpatient Medications:    albuterol (VENTOLIN HFA) 108 (90 Base) MCG/ACT inhaler, Inhale 1-2 puffs into the lungs every 6 (six) hours as needed for wheezing or shortness of breath., Disp: , Rfl:    carvedilol  (COREG ) 25 MG tablet, Take 1 tablet (25 mg total) by mouth 2 (two) times daily., Disp: 180 tablet, Rfl: 3   diazepam (VALIUM) 10 MG tablet, Take 10 mg by mouth daily., Disp: , Rfl:    FARXIGA  10 MG TABS tablet, Take 1 tablet (10 mg total) by mouth daily before breakfast. PLEASE SCHEDULE APPOINTMENT FOR MORE REFILLS, Disp: 30 tablet, Rfl: 0   furosemide  (LASIX ) 40 MG tablet, Take 1 tablet by mouth twice daily, Disp: 180 tablet, Rfl: 2   LARIN 24 FE 1-20 MG-MCG(24) tablet, Take 1 tablet by mouth daily., Disp: , Rfl:    montelukast (SINGULAIR) 10 MG tablet, Take 10 mg by mouth at bedtime., Disp: , Rfl:    Multiple Vitamins-Minerals (ADULT ONE DAILY GUMMIES PO), Take 1 capsule by mouth daily., Disp: , Rfl:    phentermine (ADIPEX-P) 37.5 MG tablet, Take 37.5 mg by mouth every morning., Disp: , Rfl:    sacubitril -valsartan  (ENTRESTO ) 97-103 MG, Take 1 tablet by mouth twice daily, Disp: 180 tablet, Rfl: 3   spironolactone  (ALDACTONE ) 25 MG tablet, Take 1 tablet by mouth once daily, Disp: 90 tablet,  Rfl: 3   Vitamin D, Ergocalciferol, (DRISDOL) 1.25 MG (50000 UNIT) CAPS capsule, Take 50,000 Units by mouth every Monday., Disp: , Rfl:

## 2024-07-15 ENCOUNTER — Encounter

## 2024-07-15 ENCOUNTER — Other Ambulatory Visit

## 2024-07-22 ENCOUNTER — Other Ambulatory Visit

## 2024-07-22 ENCOUNTER — Encounter

## 2024-08-04 ENCOUNTER — Ambulatory Visit: Admitting: Pharmacist

## 2024-08-09 ENCOUNTER — Encounter

## 2024-10-12 ENCOUNTER — Ambulatory Visit (HOSPITAL_COMMUNITY)

## 2024-11-08 ENCOUNTER — Encounter

## 2025-02-07 ENCOUNTER — Encounter
